# Patient Record
Sex: Male | Born: 1990 | Race: White | Hispanic: No | Marital: Married | Smoking: Never smoker
Health system: Southern US, Community
[De-identification: ages and names within clinical notes are randomized; demographics above are authoritative.]

## PROBLEM LIST (undated history)

## (undated) DIAGNOSIS — J329 Chronic sinusitis, unspecified: Secondary | ICD-10-CM

## (undated) DIAGNOSIS — F329 Major depressive disorder, single episode, unspecified: Secondary | ICD-10-CM

## (undated) DIAGNOSIS — F419 Anxiety disorder, unspecified: Principal | ICD-10-CM

## (undated) HISTORY — PX: WISDOM TOOTH EXTRACTION: SHX21

## (undated) HISTORY — PX: TONSILLECTOMY: SUR1361

## (undated) HISTORY — DX: Chronic sinusitis, unspecified: J32.9

## (undated) HISTORY — DX: Anxiety disorder, unspecified: F41.9

## (undated) HISTORY — DX: Major depressive disorder, single episode, unspecified: F32.9

---
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
## 2010-10-02 LAB — CBC AND DIFFERENTIAL: Hemoglobin: 15.6 g/dL (ref 13.5–17.5)

## 2012-04-12 ENCOUNTER — Ambulatory Visit (INDEPENDENT_AMBULATORY_CARE_PROVIDER_SITE_OTHER): Payer: PRIVATE HEALTH INSURANCE | Admitting: Family Medicine

## 2012-04-12 ENCOUNTER — Encounter: Payer: Self-pay | Admitting: Family Medicine

## 2012-04-12 VITALS — BP 138/74 | HR 67 | Ht 72.0 in | Wt 180.0 lb

## 2012-04-12 DIAGNOSIS — F32A Depression, unspecified: Secondary | ICD-10-CM | POA: Insufficient documentation

## 2012-04-12 DIAGNOSIS — F329 Major depressive disorder, single episode, unspecified: Secondary | ICD-10-CM

## 2012-04-12 DIAGNOSIS — F419 Anxiety disorder, unspecified: Secondary | ICD-10-CM | POA: Insufficient documentation

## 2012-04-12 DIAGNOSIS — F341 Dysthymic disorder: Secondary | ICD-10-CM

## 2012-04-12 DIAGNOSIS — J329 Chronic sinusitis, unspecified: Secondary | ICD-10-CM

## 2012-04-12 HISTORY — DX: Depression, unspecified: F32.A

## 2012-04-12 HISTORY — DX: Chronic sinusitis, unspecified: J32.9

## 2012-04-12 MED ORDER — CLONAZEPAM 0.5 MG PO TABS: 0.5000 mg | ORAL_TABLET | Freq: Two times a day (BID) | ORAL | Status: DC | PRN

## 2012-04-12 MED ORDER — ESCITALOPRAM OXALATE 20 MG PO TABS: 20.0000 mg | ORAL_TABLET | Freq: Every day | ORAL | Status: DC

## 2012-04-12 NOTE — Progress Notes (Signed)
CC: Casey Baird is a 22 y.o. male is here for Establish Care   Subjective: HPI:  Pleasant 22 year old here to establish care  Past medical history of anxiety and depression: He is currently taking Lexapro and as needed Klonopin. 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 He states his depression has greatly been controlled since starting Lexapro, denying any sadness, nor sleep disturbance nor appetite change nor relationship trouble nor thoughts of wanting to harm himself or others. He once tried Paxil and later Abilify which both cause more side effects than benefit. Family history significant for bipolar disorder on his mother side. He denies any recent or remote paranoia. He describes his anxiety and depression as less than mild these days. Exercise significantly helps both conditions. The medications have greatly improved his symptoms, his workplace will cause occasional declines, nothing else makes better or worse.    Patient complains of decreased libido ever since starting on Lexapro. He notices if he misses a dose his libido significantly increases. He is in a loving relationship with a long-time girlfriend. He describes the relationship as great. He is physically attracted toward her. He has no trouble initiating or maintaining an erection. He has no genitourinary complaints. Nothing else makes libido better or worse. Lack of libido is mild to moderate in severity and can be present any hour or day of the week  Review of Systems - General ROS: negative for - chills, fever, night sweats, weight gain or weight loss Ophthalmic ROS: negative for - decreased vision Psychological ROS: negative for - worsening anxiety or depression ENT ROS: negative for - hearing change, nasal congestion, tinnitus or allergies Hematological and Lymphatic ROS: negative for - bleeding problems, bruising or swollen lymph nodes Breast ROS:  negative Respiratory ROS: no cough, shortness of breath, or wheezing Cardiovascular ROS: no chest pain or dyspnea on exertion Gastrointestinal ROS: no abdominal pain, change in bowel habits, or black or bloody stools Genito-Urinary ROS: negative for - genital discharge, genital ulcers, incontinence or abnormal bleeding from genitals Musculoskeletal ROS: negative for - joint pain or muscle pain Neurological ROS: negative for - headaches or memory loss Dermatological ROS: negative for lumps, mole changes, rash and skin lesion changes  Past Medical History  Diagnosis Date  . Anxiety and depression 04/12/2012  . Chronic sinusitis 04/12/2012     Family History  Problem Relation Age of Onset  . Alcohol abuse Maternal Grandfather   . Depression    . Diabetes Maternal Uncle      History  Substance Use Topics  . Smoking status: Never Smoker   . Smokeless tobacco: Not on file  . Alcohol Use: 1.0 oz/week    2 drink(s) per week     Objective: Filed Vitals:   04/12/12 1330  BP: 138/74  Pulse: 67    General: Alert and Oriented, No Acute Distress HEENT: Pupils equal, round, reactive to light. Conjunctivae clear.   Moist mucous membranes,  Lungs: Clear to auscultation bilaterally, no wheezing/ronchi/rales.  Comfortable work of breathing. Good air movement. Cardiac: Regular rate and rhythm. Normal S1/S2.  No murmurs, rubs, nor gallops.   Extremities: No peripheral edema.  Strong peripheral pulses.  Mental Status: No depression, anxiety, nor agitation. Skin: Warm and dry.  Assessment & Plan: Casey Baird was seen today for establish care.  Diagnoses and associated orders for this visit:  Anxiety and depression - escitalopram (LEXAPRO) 20 MG tablet; Take 1  tablet (20 mg total) by mouth daily. - clonazePAM (KLONOPIN) 0.5 MG tablet; Take 1 tablet (0.5 mg total) by mouth 2 (two) times daily as needed.  Other Orders - Discontinue: escitalopram (LEXAPRO) 10 MG tablet; Take 10 mg by mouth  daily. - Discontinue: clonazePAM (KLONOPIN) 0.5 MG tablet; Take 0.5 mg by mouth 2 (two) times daily as needed.    Anxiety and depression: Stable, with [place] controlled substance database reviewed and shows no suspicious filling patterns. We'll provide patient with one month of Klonopin while outside records are present at our office. Continue current Lexapro regimen.  We discussed the possibility of switching to another SSRI/SNRI to see if we can minimize sexual side effects, he would prefer to continue with Lexapro given its fantastic response to combating anxiety and depression.  Discussed with patient that Lexapro is most likely the culprit 2 low libido.  30 minutes spent face-to-face during visit today of which at least 50% was counseling or coordinating care regarding anxiety, depression, low libido.   Return in about 4 weeks (around 05/10/2012).

## 2012-04-14 ENCOUNTER — Encounter: Payer: Self-pay | Admitting: *Deleted

## 2012-04-15 ENCOUNTER — Encounter: Payer: Self-pay | Admitting: Family Medicine

## 2012-04-15 ENCOUNTER — Telehealth: Payer: Self-pay | Admitting: Family Medicine

## 2012-04-15 DIAGNOSIS — F329 Major depressive disorder, single episode, unspecified: Secondary | ICD-10-CM

## 2012-04-15 NOTE — Telephone Encounter (Signed)
Sue Lush, Will you please let Casey Baird know that I have placed his Dr. Marchia Bond records at the front desk, it looks like he wanted them after we received them. Thank you.

## 2012-04-15 NOTE — Telephone Encounter (Signed)
Left message on vm

## 2012-04-16 ENCOUNTER — Encounter: Payer: Self-pay | Admitting: *Deleted

## 2012-04-27 ENCOUNTER — Ambulatory Visit (INDEPENDENT_AMBULATORY_CARE_PROVIDER_SITE_OTHER): Payer: PRIVATE HEALTH INSURANCE | Admitting: Family Medicine

## 2012-04-27 ENCOUNTER — Encounter: Payer: Self-pay | Admitting: Family Medicine

## 2012-04-27 VITALS — BP 121/62 | HR 65 | Ht 73.0 in | Wt 181.0 lb

## 2012-04-27 DIAGNOSIS — F329 Major depressive disorder, single episode, unspecified: Secondary | ICD-10-CM

## 2012-04-27 DIAGNOSIS — Z131 Encounter for screening for diabetes mellitus: Secondary | ICD-10-CM

## 2012-04-27 DIAGNOSIS — F419 Anxiety disorder, unspecified: Secondary | ICD-10-CM

## 2012-04-27 DIAGNOSIS — Q833 Accessory nipple: Secondary | ICD-10-CM | POA: Insufficient documentation

## 2012-04-27 DIAGNOSIS — Z0000 Encounter for general adult medical examination without abnormal findings: Secondary | ICD-10-CM

## 2012-04-27 DIAGNOSIS — Z13220 Encounter for screening for lipoid disorders: Secondary | ICD-10-CM

## 2012-04-27 DIAGNOSIS — L723 Sebaceous cyst: Secondary | ICD-10-CM

## 2012-04-27 DIAGNOSIS — Z23 Encounter for immunization: Secondary | ICD-10-CM

## 2012-04-27 DIAGNOSIS — Q838 Other congenital malformations of breast: Secondary | ICD-10-CM

## 2012-04-27 DIAGNOSIS — F341 Dysthymic disorder: Secondary | ICD-10-CM

## 2012-04-27 LAB — LIPID PANEL
LDL Cholesterol: 87 mg/dL (ref 0–99)
Total CHOL/HDL Ratio: 4.2 Ratio
VLDL: 39 mg/dL (ref 0–40)

## 2012-04-27 LAB — BASIC METABOLIC PANEL WITH GFR
BUN: 8 mg/dL (ref 6–23)
CO2: 31 mEq/L (ref 19–32)
Chloride: 102 mEq/L (ref 96–112)
Creat: 0.83 mg/dL (ref 0.50–1.35)
GFR, Est Non African American: >89 mL/min
Glucose, Bld: 96 mg/dL (ref 70–99)
Sodium: 140 mEq/L (ref 135–145)

## 2012-04-27 NOTE — Patient Instructions (Addendum)
Dr. Shams Fill's General Advice Following Your Complete Physical Exam  The Benefits of Regular Exercise: Unless you suffer from an uncontrolled cardiovascular condition, studies strongly suggest that regular exercise and physical activity will add to both the quality and length of your life.  The World Health Organization recommends 150 minutes of moderate intensity aerobic activity every week.  This is best split over 3-4 days a week, and can be as simple as a brisk walk for just over 35 minutes "most days of the week".  This type of exercise has been shown to lower LDL-Cholesterol, lower average blood sugars, lower blood pressure, lower cardiovascular disease risk, improve memory, and increase one's overall sense of wellbeing.  The addition of anaerobic (or "strength training") exercises offers additional benefits including but not limited to increased metabolism, prevention of osteoporosis, and improved overall cholesterol levels.  How Can I Strive For A Low-Fat Diet?: Current guidelines recommend that 25-35 percent of your daily energy (food) intake should come from fats.  One might ask how can this be achieved without having to dissect each meal on a daily basis?  Switch to skim or 1% milk instead of whole milk.  Focus on lean meats such as ground turkey, fresh fish, baked chicken, and lean cuts of beef as your source of dietary protein.  Consume less than 300mg/day of dietary cholesterol.  Limit trans fatty acid consumption primarily by limiting synthetic trans fats such as partially hydrogenated oils (Ex: fried fast foods).  Focus efforts on reducing your intake of "solid" fats (Ex: Butter).  Substitute olive or vegetable oil for solid fats where possible.  Moderation of Salt Intake: Provided you don't carry a diagnosis of congestive heart failure nor renal failure, I recommend a daily allowance of no more than 2300 mg of salt (sodium).  Keeping under this daily goal is associated with a  decreased risk of cardiovascular events, creeping above it can lead to elevated blood pressures and increases your risk of cardiovascular events.  Milligrams (mg) of salt is listed on all nutrition labels, and your daily intake can add up faster than you think.  Most canned and frozen dinners can pack in over half your daily salt allowance in one meal.    Lifestyle Health Risks: Certain lifestyle choices carry specific health risks.  As you may already know, tobacco use has been associated with increasing one's risk of cardiovascular disease, pulmonary disease, numerous cancers, among many other issues.  What you may not know is that there are medications and nicotine replacement strategies that can more than double your chances of successfully quitting.  I would be thrilled to help manage your quitting strategy if you currently use tobacco products.  When it comes to alcohol use, I've yet to find an "ideal" daily allowance.  Provided an individual does not have a medical condition that is exacerbated by alcohol consumption, general guidelines determine "safe drinking" as no more than two standard drinks for a man or no more than one standard drink for a male per day.  However, much debate still exists on whether any amount of alcohol consumption is technically "safe".  My general advice, keep alcohol consumption to a minimum for general health promotion.  If you or others believe that alcohol, tobacco, or recreational drug use is interfering with your life, I would be happy to provide confidential counseling regarding treatment options.  General "Over The Counter" Nutrition Advice: Postmenopausal women should aim for a daily calcium intake of 1200 mg, however a significant   portion of this might already be provided by diets including milk, yogurt, cheese, and other dairy products.  Vitamin D has been shown to help preserve bone density, prevent fatigue, and has even been shown to help reduce falls in the  elderly.  Ensuring a daily intake of 800 Units of Vitamin D is a good place to start to enjoy the above benefits, we can easily check your Vitamin D level to see if you'd potentially benefit from supplementation beyond 800 Units a day.  Folic Acid intake should be of particular concern to women of childbearing age.  Daily consumption of 400-800 mcg of Folic Acid is recommended to minimize the chance of spinal cord defects in a fetus should pregnancy occur.    For many adults, accidents still remain one of the most common culprits when it comes to cause of death.  Some of the simplest but most effective preventitive habits you can adopt include regular seatbelt use, proper helmet use, securing firearms, and regularly testing your smoke and carbon monoxide detectors.  Selig Wampole B. Khair Chasteen DO [facility] [address], [address] [place], [place] [address] Phone: [phone]  

## 2012-04-27 NOTE — Progress Notes (Signed)
CC: Casey Baird is a 22 y.o. male is here for Annual Exam  Colonoscopy: No family history of colon cancer nor personal history of melanoma or bloody stools Prostate: Discussed screening risks/beneifts with patient on 04/27/2012  Influenza Vaccine: received 04/27/2012 Pneumovax: not indicated Td/Tdap: Td 2005 Zoster: not indicated  Lipids 2009 and normal  Subjective: HPI:  Patient presents with no acute complaints  Over the past 2 weeks have you been bothered by: - Little interest or pleasure in doing things: no - Feeling down depressed or hopeless: no  Patient is trying to get into an exercise program on a daily basis but 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 Tries to watch what he eats but admits room for improvement. Denies smoking or recreational drug abuse. Rare alcohol use. Sexually active with a single  partner  Review of Systems - General ROS: negative for - chills, fever, night sweats, weight gain or weight loss Ophthalmic ROS: negative for - decreased vision Psychological ROS: negative for - anxiety or depression ENT ROS: negative for - hearing change, nasal congestion, tinnitus or allergies Hematological and Lymphatic ROS: negative for - bleeding problems, bruising or swollen lymph nodes Breast ROS: negative Respiratory ROS: no cough, shortness of breath, or wheezing Cardiovascular ROS: no chest pain or dyspnea on exertion Gastrointestinal ROS: no abdominal pain, change in bowel habits, or black or bloody stools Genito-Urinary ROS: negative for - genital discharge, genital ulcers, incontinence or abnormal bleeding from genitals Musculoskeletal ROS: negative for - joint pain or muscle pain Neurological ROS: negative for - headaches or memory loss Dermatological ROS: negative for lumps, mole changes, rash and skin lesion changes  Past Medical History  Diagnosis Date  . Anxiety and depression 04/12/2012  . Chronic sinusitis 04/12/2012     Family  History  Problem Relation Age of Onset  . Alcohol abuse Maternal Grandfather   . Depression    . Diabetes Maternal Uncle      History  Substance Use Topics  . Smoking status: Never Smoker   . Smokeless tobacco: Not on file  . Alcohol Use: 1.0 oz/week    2 drink(s) per week     Objective: Filed Vitals:   04/27/12 0914  BP: 121/62  Pulse: 65    General: No Acute Distress HEENT: Atraumatic, normocephalic, conjunctivae normal without scleral icterus.  No nasal discharge, hearing grossly intact, TMs with good landmarks bilaterally with no middle ear abnormalities, posterior pharynx clear without oral lesions. Neck: Supple, trachea midline, no cervical nor supraclavicular adenopathy. Pulmonary: Clear to auscultation bilaterally without wheezing, rhonchi, nor rales. Cardiac: Regular rate and rhythm.  No murmurs, rubs, nor gallops. No peripheral edema.  2+ peripheral pulses bilaterally. Abdomen: Bowel sounds normal.  No masses.  Non-tender without rebound.  Negative Murphy's sign. GU: Patient declines MSK: Grossly intact, no signs of weakness.  Full strength throughout upper and lower extremities.  Full ROM in upper and lower extremities.  No midline spinal tenderness. Neuro: Gait unremarkable, CN II-XII grossly intact.  C5-C6 Reflex 2/4 Bilaterally, L4 Reflex 2/4 Bilaterally.  Cerebellar function intact. Skin: No rashes. 2 accessory nipples, tender 1 cm subcutaneous nodule in the left elbow Psych: Alert and oriented to person/place/time.  Thought process normal. No anxiety/depression.  Assessment & Plan: Casey Baird was seen today for annual exam.  Diagnoses and associated orders for this visit:  Screening, lipid - Lipid panel  Screening for diabetes mellitus - BASIC METABOLIC PANEL WITH GFR  Inflamed sebaceous cyst - Ambulatory referral to  Dermatology  Anxiety and depression  Supernumerary nipple    Patient is due for screening lipids as is been more than 5 years  Patient is  due for diabetic screening Sebaceous cyst: Patient would like removal if possible, referral to dermatology Health maintenance topics were updated about the history of present illness, discussed healthy lifestyle interventions in order to promote general health and well-being. Handout given to the patient re emphasizing these topics.  Return in about 3 months (around 07/25/2012).

## 2012-05-26 ENCOUNTER — Ambulatory Visit: Payer: PRIVATE HEALTH INSURANCE | Admitting: Sports Medicine

## 2012-05-26 ENCOUNTER — Ambulatory Visit (INDEPENDENT_AMBULATORY_CARE_PROVIDER_SITE_OTHER): Payer: PRIVATE HEALTH INSURANCE | Admitting: Family Medicine

## 2012-05-26 ENCOUNTER — Encounter: Payer: Self-pay | Admitting: Family Medicine

## 2012-05-26 VITALS — BP 123/71 | HR 80 | Temp 98.0°F | Wt 180.0 lb

## 2012-05-26 MED ORDER — CEFUROXIME AXETIL 250 MG PO TABS: 250.0000 mg | ORAL_TABLET | Freq: Two times a day (BID) | ORAL | Status: DC

## 2012-05-26 NOTE — Progress Notes (Signed)
CC: Casey Baird is a 22 y.o. male is here for URI   Subjective: HPI:   Patient complains of facial pressure and nasal congestion with moderate to severe in severity. Symptoms came on suddenly Monday evening. Originally accompanied by sore throat that is now gone. Symptoms are present all hours of the day worse first thing in the morning and improves throughout the day with use of DayQuil. Symptoms were to the degree where he had to call out of work today do to feeling uncomfortable. He's had some mild fatigue but denies myalgias or joint pain. He denies fevers, chills, confusion, motor sensory disturbances, cough, shortness of breath, wheezing. Other than above nothing seems to make the symptoms better or worse. Pressure is localized in the forehead and worse with leaning forward.     Review Of Systems Outlined In HPI  Past Medical History  Diagnosis Date  . Anxiety and depression 04/12/2012  . Chronic sinusitis 04/12/2012     Family History  Problem Relation Age of Onset  . Alcohol abuse Maternal Grandfather   . Depression    . Diabetes Maternal Uncle      History  Substance Use Topics  . Smoking status: Never Smoker   . Smokeless tobacco: Not on file  . Alcohol Use: 1.0 oz/week    2 drink(s) per week     Objective: Filed Vitals:   05/26/12 1510  BP: 123/71  Pulse: 80  Temp: 98 F (36.7 C)    General: Alert and Oriented, No Acute Distress HEENT: Pupils equal, round, reactive to light. Conjunctivae clear.  External ears unremarkable, canals clear with intact TMs with appropriate landmarks.  Middle ear appears open without effusion. Erythematous and boggy inferior turbinates with moderate mucoid discharge bilaterally.  Moist mucous membranes, pharynx without inflammation nor lesions.  Neck supple without palpable lymphadenopathy nor abnormal masses. Frontal sinus tenderness to percussion Lungs: Clear to auscultation bilaterally, no wheezing/ronchi/rales.  Comfortable work of  breathing. Good air movement. Cardiac: Regular rate and rhythm. Normal S1/S2.  No murmurs, rubs, nor gallops.   Cranial nerves II through XII grossly intact Mental Status: No depression, anxiety, nor agitation. Skin: Warm and dry.  Assessment & Plan: Casey Baird was seen today for uri.  Diagnoses and associated orders for this visit:  Sinusitis - cefUROXime (CEFTIN) 250 MG tablet; Take 1 tablet (250 mg total) by mouth 2 (two) times daily.    Discussed with patient that at this point symptoms are most likely due to a viral etiology. If symptoms persist a day 7 and are worsening or not improved it would be wise to start antibiotic. He reports multiple treatment failures to Augmentin in the past. He reports success with Ceftin in the past for above symptoms lasting greater than 7 days. Continued DayQuil and may add ibuprofen with nasal saline washes to help symptoms. Signs and symptoms requring emergent/urgent reevaluation were discussed with the patient.  Return if symptoms worsen or fail to improve.

## 2012-06-17 ENCOUNTER — Encounter: Payer: Self-pay | Admitting: Family Medicine

## 2012-06-17 DIAGNOSIS — J329 Chronic sinusitis, unspecified: Secondary | ICD-10-CM

## 2012-07-14 ENCOUNTER — Telehealth: Payer: Self-pay | Admitting: *Deleted

## 2012-07-14 DIAGNOSIS — F329 Major depressive disorder, single episode, unspecified: Secondary | ICD-10-CM

## 2012-07-14 MED ORDER — CLONAZEPAM 0.5 MG PO TABS: 0.5000 mg | ORAL_TABLET | Freq: Two times a day (BID) | ORAL | Status: DC | PRN

## 2012-07-14 NOTE — Telephone Encounter (Signed)
rx faxed to [facility]

## 2012-07-14 NOTE — Telephone Encounter (Signed)
[place] controlled substances database reviewed.  Sue Lush, Rx placed in you inbox ready for pick up or sending.

## 2012-07-14 NOTE — Telephone Encounter (Signed)
Pt calls and request a refill on the Klonopin

## 2012-07-19 ENCOUNTER — Ambulatory Visit (INDEPENDENT_AMBULATORY_CARE_PROVIDER_SITE_OTHER): Payer: PRIVATE HEALTH INSURANCE | Admitting: Family Medicine

## 2012-07-19 DIAGNOSIS — Z23 Encounter for immunization: Secondary | ICD-10-CM

## 2012-07-19 NOTE — Progress Notes (Signed)
I was present for all necessary aspects of this visit

## 2012-07-19 NOTE — Progress Notes (Signed)
  Subjective:    Patient ID: Casey Baird, male    DOB: [birth year], 22 y.o.   MRN: [id number] Tdap injection given right deltoid without complications. Barry Dienes, LPN  HPI    Review of Systems     Objective:   Physical Exam        Assessment & Plan:

## 2012-07-26 ENCOUNTER — Encounter: Payer: Self-pay | Admitting: Family Medicine

## 2012-07-26 ENCOUNTER — Ambulatory Visit (INDEPENDENT_AMBULATORY_CARE_PROVIDER_SITE_OTHER): Payer: PRIVATE HEALTH INSURANCE | Admitting: Family Medicine

## 2012-07-26 VITALS — BP 99/61 | HR 58 | Wt 177.0 lb

## 2012-07-26 DIAGNOSIS — J309 Allergic rhinitis, unspecified: Secondary | ICD-10-CM

## 2012-07-26 DIAGNOSIS — F329 Major depressive disorder, single episode, unspecified: Secondary | ICD-10-CM

## 2012-07-26 DIAGNOSIS — E781 Pure hyperglyceridemia: Secondary | ICD-10-CM

## 2012-07-26 DIAGNOSIS — F341 Dysthymic disorder: Secondary | ICD-10-CM

## 2012-07-26 DIAGNOSIS — J358 Other chronic diseases of tonsils and adenoids: Secondary | ICD-10-CM | POA: Insufficient documentation

## 2012-07-26 MED ORDER — ESCITALOPRAM OXALATE 20 MG PO TABS: 10.0000 mg | ORAL_TABLET | Freq: Every day | ORAL | Status: DC

## 2012-07-26 MED ORDER — BECLOMETHASONE DIPROPIONATE 80 MCG/ACT NA AERS: 2.0000 | INHALATION_SPRAY | Freq: Every day | NASAL | Status: DC

## 2012-07-26 NOTE — Progress Notes (Signed)
CC: Casey Baird is a 22 y.o. male is here for Anxiety   Subjective: HPI:  Followup anxiety and depression: Continues on daily Lexapro he decrease this to 10 mg a little over a month ago to try to stretch medication regimen and denies any worsening subjective anxiety or depression. Denies sleep disturbance, decreased interest in hobbies, nor relationship trouble. Continues Klonopin twice a day only as needed. Denies mental disturbance. He's quite happy with his current regimen and quality of life  Patient complains of clumps of matter that come out of his tonsils. Occurs a few times a year. They're painless but due have a horrible odor. He's currently having some throat irritation right now and wondering if this is related to one that was recently removed. Describes this right-sided soreness, worse with swallowing, nothing else seems to make better or worse. No interventions as of yet. Discomfort described as mild in severity. Has been present for a week or 2. Denies fevers, chills, nausea, choking, shortness of breath. Does admit to nasal congestion worse than normal for him.  He would like to go over lipid panel showing triglycerides 190 from February. Since then has increased physical activity.  Denies personal history of right upper quadrant pain, elevated LFTs, nor pancreatitis.  Review Of Systems Outlined In HPI  Past Medical History  Diagnosis Date  . Anxiety and depression 04/12/2012  . Chronic sinusitis 04/12/2012     Family History  Problem Relation Age of Onset  . Alcohol abuse Maternal Grandfather   . Depression    . Diabetes Maternal Uncle      History  Substance Use Topics  . Smoking status: Never Smoker   . Smokeless tobacco: Not on file  . Alcohol Use: 1.0 oz/week    2 drink(s) per week     Objective: Filed Vitals:   07/26/12 0940  BP: 99/61  Pulse: 58    General: Alert and Oriented, No Acute Distress HEENT: Pupils equal, round, reactive to light. Conjunctivae  clear.  External ears unremarkable, canals clear with intact TMs with appropriate landmarks.  Middle ear appears open without effusion. Pink inferior turbinates.  Moist mucous membranes, pharynx without inflammation nor lesions.  Neck supple without palpable lymphadenopathy nor abnormal masses. Lungs: Clear to auscultation bilaterally, no wheezing/ronchi/rales.  Comfortable work of breathing. Good air movement. Cardiac: Regular rate and rhythm. Normal S1/S2.  No murmurs, rubs, nor gallops.   Extremities: No peripheral edema.  Strong peripheral pulses.  Mental Status: No depression, anxiety, nor agitation. Skin: Warm and dry.  Assessment & Plan: Casey Baird was seen today for anxiety.  Diagnoses and associated orders for this visit:  Anxiety and depression - escitalopram (LEXAPRO) 20 MG tablet; Take 0.5 tablets (10 mg total) by mouth daily.  Allergic sinusitis - Beclomethasone Dipropionate (QNASL) 80 MCG/ACT AERS; Place 2 Act into the nose daily.  Calculus, tonsil  Hypertriglyceridemia    Anxiety and depression: Stable and controlled continue Lexapro and as needed Klonopin Allergic sinusitis: Throat irritation is likely postnasal drip, sample of QNasl.   Calculus tonsil: Discussed using   hydrogen peroxide gargles to prevent future calculus, can consider WaterPik or water pressurized using syringe daily Hypertriglyceridemia: Discussed goals less than 150 and to focus on moderate activity daily for 30 minutes or more in addition to diet low in simple sugars and low in saturated fats, can recheck the summer  25 minutes spent face-to-face during visit today of which at least 50% was counseling or coordinating care regarding hypertriglyceridemia, tonsil calculus, allergic  sinusitis, anxiety and depression.   Return in about 3 weeks (around 08/16/2012).

## 2012-10-20 ENCOUNTER — Ambulatory Visit (INDEPENDENT_AMBULATORY_CARE_PROVIDER_SITE_OTHER): Payer: PRIVATE HEALTH INSURANCE | Admitting: Physician Assistant

## 2012-10-20 ENCOUNTER — Encounter: Payer: Self-pay | Admitting: Physician Assistant

## 2012-10-20 VITALS — BP 131/75 | HR 87 | Wt 180.0 lb

## 2012-10-20 DIAGNOSIS — J3489 Other specified disorders of nose and nasal sinuses: Secondary | ICD-10-CM

## 2012-10-20 DIAGNOSIS — J309 Allergic rhinitis, unspecified: Secondary | ICD-10-CM

## 2012-10-20 DIAGNOSIS — R0981 Nasal congestion: Secondary | ICD-10-CM

## 2012-10-20 MED ORDER — BECLOMETHASONE DIPROPIONATE 80 MCG/ACT NA AERS: 2.0000 | INHALATION_SPRAY | Freq: Every day | NASAL | Status: DC

## 2012-10-20 MED ORDER — LEVOCETIRIZINE DIHYDROCHLORIDE 5 MG PO TABS: 5.0000 mg | ORAL_TABLET | Freq: Every evening | ORAL | Status: DC

## 2012-10-20 MED ORDER — PREDNISONE 50 MG PO TABS: ORAL_TABLET | ORAL | Status: DC

## 2012-10-20 NOTE — Progress Notes (Signed)
  Subjective:    Patient ID: Casey Baird, male    DOB: [birth year], 21 y.o.   MRN: [id number]  HPI Patient presents to the clinic for allergies. He is a pt of Dr. Gardner Candle. He has a long history of allergies. Dr. Ivan Anchors gave him qnasl and it did help but he ran out. For the last 3 weeks off and on sinus pressure, itchy eyes, ear pain have been present. He feels completely drained. He takes sudafed off and on and helps significantly. OTC zyrtec,claritin, allegra have been tried but do not help.singulair tried but also did not help. Denies ST, cough, SOB, wheezing.      Review of Systems     Objective:   Physical Exam  Constitutional: He is oriented to person, place, and time. He appears well-developed and well-nourished.  HENT:  Head: Normocephalic and atraumatic.  Right Ear: External ear normal.  Left Ear: External ear normal.  TM's clear.   Oropharynx was erythematous with PND.   No pain with palpation over maxillary or frontal sinuses.   Nasal turbinates red and swollen.  Eyes: Right eye exhibits no discharge. Left eye exhibits no discharge.  Right eye mild injection in the lateral conjunctiva.  Neck: Normal range of motion. Neck supple.  Cardiovascular: Normal rate, regular rhythm and normal heart sounds.   Pulmonary/Chest: Effort normal and breath sounds normal. He has no wheezes.  Lymphadenopathy:    He has no cervical adenopathy.  Neurological: He is alert and oriented to person, place, and time.  Skin: Skin is warm and dry.  Psychiatric: He has a normal mood and affect. His behavior is normal.          Assessment & Plan:  Allergies/sinus pressure/ear pain/chronic sinusitis- I gave rx for qnasl with coupon card. I sent rx for xyzal to try daily for allergies. I printed prednisone for 5 days. If no relief with nasal spray then fill prednisone to get ahead of inflammation and sinus pressure. I do not think bacterial since good and bad with decongestant. i will refer to  allergist for testing. I think he might be a candidate for shots. Follow up with Dr. Ivan Anchors.

## 2012-10-20 NOTE — Patient Instructions (Addendum)
xyzal to take daily.   Prednisone if inflammation or sinus pressure does not improve.   Use Qnasl daily.  Will refer to allergist for testing.

## 2012-10-25 ENCOUNTER — Ambulatory Visit: Payer: PRIVATE HEALTH INSURANCE | Admitting: Family Medicine

## 2012-11-01 ENCOUNTER — Ambulatory Visit (INDEPENDENT_AMBULATORY_CARE_PROVIDER_SITE_OTHER): Payer: PRIVATE HEALTH INSURANCE | Admitting: Family Medicine

## 2012-11-01 ENCOUNTER — Other Ambulatory Visit: Payer: Self-pay | Admitting: Family Medicine

## 2012-11-01 ENCOUNTER — Encounter: Payer: Self-pay | Admitting: Family Medicine

## 2012-11-01 VITALS — BP 124/73 | HR 64 | Wt 178.0 lb

## 2012-11-01 DIAGNOSIS — F341 Dysthymic disorder: Secondary | ICD-10-CM

## 2012-11-01 DIAGNOSIS — E781 Pure hyperglyceridemia: Secondary | ICD-10-CM | POA: Insufficient documentation

## 2012-11-01 DIAGNOSIS — F329 Major depressive disorder, single episode, unspecified: Secondary | ICD-10-CM

## 2012-11-01 LAB — LIPID PANEL
Cholesterol: 178 mg/dL (ref 0–200)
LDL Cholesterol: 101 mg/dL — ABNORMAL HIGH (ref 0–99)
Total CHOL/HDL Ratio: 3.7 Ratio
VLDL: 29 mg/dL (ref 0–40)

## 2012-11-01 MED ORDER — ESCITALOPRAM OXALATE 20 MG PO TABS: 10.0000 mg | ORAL_TABLET | Freq: Every day | ORAL | Status: DC

## 2012-11-01 MED ORDER — CLONAZEPAM 0.5 MG PO TABS: 0.5000 mg | ORAL_TABLET | Freq: Two times a day (BID) | ORAL | Status: DC | PRN

## 2012-11-01 NOTE — Progress Notes (Signed)
CC: Casey Baird is a 22 y.o. male is here for Anxiety   Subjective: HPI:  Followup anxiety depression: Continues to take Klonopin only on days he is working has not been taking it on days off. He denies tremor, paranoia, anxiety, sleep disturbance, nor hallucinations. Denies thoughts wanting to harm himself or others. He is quite happy with his current lack of anxiety and depression  Hypertriglyceridemia: Since I saw him last he is slightly increased physical activity no change in diet. Still occasionally eats fast foods. Denies right upper quadrant pain nor epigastric pain   Review Of Systems Outlined In HPI  Past Medical History  Diagnosis Date  . Anxiety and depression 04/12/2012  . Chronic sinusitis 04/12/2012     Family History  Problem Relation Age of Onset  . Alcohol abuse Maternal Grandfather   . Depression    . Diabetes Maternal Uncle      History  Substance Use Topics  . Smoking status: Never Smoker   . Smokeless tobacco: Not on file  . Alcohol Use: 1.0 oz/week    2 drink(s) per week     Objective: Filed Vitals:   11/01/12 1103  BP: 124/73  Pulse: 64    Vital signs reviewed. General: Alert and Oriented, No Acute Distress HEENT: Pupils equal, round, reactive to light. Conjunctivae clear.  External ears unremarkable.  Moist mucous membranes. Lungs: Clear and comfortable work of breathing, speaking in full sentences without accessory muscle use. Cardiac: Regular rate and rhythm.  Neuro: CN II-XII grossly intact, gait normal. Extremities: No peripheral edema.  Strong peripheral pulses.  Mental Status: No depression, anxiety, nor agitation. Logical though process. Skin: Warm and dry. Assessment & Plan: [name] was seen today for anxiety.  Diagnoses and associated orders for this visit:  Hypertriglyceridemia - Lipid panel  Anxiety and depression - clonazePAM (KLONOPIN) 0.5 MG tablet; Take 1 tablet (0.5 mg total) by mouth 2 (two) times daily as  needed. - escitalopram (LEXAPRO) 20 MG tablet; Take 0.5 tablets (10 mg total) by mouth daily.    Hypertriglyceridemia: Uncontrolled he is due for repeat fasting lipids we'll obtain today Anxiety and depression: Controlled continue Lexapro and as needed Klonopin  Return in about 3 months (around 02/01/2013).

## 2012-11-24 ENCOUNTER — Ambulatory Visit (INDEPENDENT_AMBULATORY_CARE_PROVIDER_SITE_OTHER): Payer: PRIVATE HEALTH INSURANCE | Admitting: Family Medicine

## 2012-11-24 ENCOUNTER — Encounter: Payer: Self-pay | Admitting: Family Medicine

## 2012-11-24 VITALS — BP 125/72 | HR 84 | Temp 97.4°F | Wt 181.0 lb

## 2012-11-24 DIAGNOSIS — J329 Chronic sinusitis, unspecified: Secondary | ICD-10-CM

## 2012-11-24 DIAGNOSIS — B9689 Other specified bacterial agents as the cause of diseases classified elsewhere: Secondary | ICD-10-CM

## 2012-11-24 DIAGNOSIS — A499 Bacterial infection, unspecified: Secondary | ICD-10-CM

## 2012-11-24 MED ORDER — LEVOFLOXACIN 500 MG PO TABS: 500.0000 mg | ORAL_TABLET | Freq: Every day | ORAL | Status: DC

## 2012-11-24 MED ORDER — AMOXICILLIN-POT CLAVULANATE 500-125 MG PO TABS: ORAL_TABLET | ORAL | Status: DC

## 2012-11-24 NOTE — Progress Notes (Signed)
CC: Casey Baird is a 23 y.o. male is here for Sinusitis   Subjective: HPI:  Patient complains of nasal congestion and facial pressure localized below and between the eyes and has been present for one week worsening on a daily basis. Associated with fatigue, nonproductive cough, all of which have been worsening a daily basis. He continues on Allegra and she nasal no other changes to medications. Symptoms are present on a daily basis all hours of the day nothing particularly makes better or worse currently moderate severity. Denies wheezing, shortness of breath, motor sensory disturbances, headache, vision problems, chest pain   Review Of Systems Outlined In HPI  Past Medical History  Diagnosis Date  . Anxiety and depression 04/12/2012  . Chronic sinusitis 04/12/2012     Family History  Problem Relation Age of Onset  . Alcohol abuse Maternal Grandfather   . Depression    . Diabetes Maternal Uncle      History  Substance Use Topics  . Smoking status: Never Smoker   . Smokeless tobacco: Not on file  . Alcohol Use: 1.0 oz/week    2 drink(s) per week     Objective: Filed Vitals:   11/24/12 1452  BP: 125/72  Pulse: 84  Temp: 97.4 F (36.3 C)    General: Alert and Oriented, No Acute Distress HEENT: Pupils equal, round, reactive to light. Conjunctivae clear.  External ears unremarkable, canals clear with intact TMs with appropriate landmarks.  Middle ear appears open without effusion. Bony inferior turbinates with moderate mucoid discharge  Moist mucous membranes, pharynx without inflammation nor lesions.  Neck supple without palpable lymphadenopathy nor abnormal masses. Maxillary sinus tenderness bilaterally Lungs: Clear to auscultation bilaterally, no wheezing/ronchi/rales.  Comfortable work of breathing. Good air movement. Cardiac: Regular rate and rhythm. Normal S1/S2.  No murmurs, rubs, nor gallops.   Mental Status: No depression, anxiety, nor agitation. Skin: Warm and  dry. Cranial nerves II through XII grossly intact  Assessment & Plan: Casey Baird was seen today for sinusitis.  Diagnoses and associated orders for this visit:  Bacterial sinusitis - Discontinue: amoxicillin-clavulanate (AUGMENTIN) 500-125 MG per tablet; Take one by mouth every 8 hours for ten total days. - levofloxacin (LEVAQUIN) 500 MG tablet; Take 1 tablet (500 mg total) by mouth daily.    Bacterial sinusitis: Patient reports GI intolerance to Augmentin therefore we'll start levofloxacin encouraged to consider Alka-Seltzer cold and sinus as needed   Return if symptoms worsen or fail to improve.

## 2013-01-24 ENCOUNTER — Telehealth: Payer: Self-pay | Admitting: Family Medicine

## 2013-01-24 DIAGNOSIS — F329 Major depressive disorder, single episode, unspecified: Secondary | ICD-10-CM

## 2013-01-24 NOTE — Telephone Encounter (Signed)
Left message on vm to call back and let us know what rx he needs refill and we will either refill it or he may need a visit

## 2013-01-24 NOTE — Telephone Encounter (Signed)
Pt left vm. When he called pharmacy, he was told his script had expired and he would need to contact his dr's office.

## 2013-01-25 MED ORDER — CLONAZEPAM 0.5 MG PO TABS: 0.5000 mg | ORAL_TABLET | Freq: Two times a day (BID) | ORAL | Status: DC | PRN

## 2013-01-25 NOTE — Addendum Note (Signed)
Addended by: Laren Boom on: 01/25/2013 01:37 PM   Modules accepted: Orders

## 2013-01-25 NOTE — Telephone Encounter (Signed)
Casey Baird, Rx placed in in-box ready for pickup/faxing.  

## 2013-01-27 ENCOUNTER — Encounter: Payer: Self-pay | Admitting: Family Medicine

## 2013-01-27 ENCOUNTER — Ambulatory Visit (INDEPENDENT_AMBULATORY_CARE_PROVIDER_SITE_OTHER): Payer: PRIVATE HEALTH INSURANCE | Admitting: Family Medicine

## 2013-01-27 ENCOUNTER — Other Ambulatory Visit: Payer: Self-pay

## 2013-01-27 VITALS — BP 130/75 | HR 77 | Temp 97.9°F | Wt 185.0 lb

## 2013-01-27 DIAGNOSIS — F341 Dysthymic disorder: Secondary | ICD-10-CM

## 2013-01-27 DIAGNOSIS — F329 Major depressive disorder, single episode, unspecified: Secondary | ICD-10-CM

## 2013-01-27 DIAGNOSIS — J309 Allergic rhinitis, unspecified: Secondary | ICD-10-CM

## 2013-01-27 MED ORDER — MONTELUKAST SODIUM 10 MG PO TABS: 10.0000 mg | ORAL_TABLET | ORAL | Status: DC

## 2013-01-27 NOTE — Progress Notes (Signed)
CC: Casey Baird is a 22 y.o. male is here for Headache   Subjective: HPI:  Patient presents for complaints of headache that was present when he woke up this morning woke up with headache not because of headache. Described as pressure in the forehead nonradiating mild in severity. Bothersome enough to where he called out of work. Has been associated with worsening nasal congestion over the past few weeks despite using pseudoephedrine and Q. nasal daily.  Denies motor sensory disturbances or vision loss prior during or after the headache. It is for the most part gone now without any intervention.  Denies photophobia no neck pain or rash.  Complains that he feels that his depression seems to be worsening last month he was absent mouth mild in severity present on a daily basis worse with the change in daily, also worse with worsening seasonal allergies. No thoughts of wanting to harm self or others but does have decreased interest in social activities and hobbies. Symptoms seem to be persistent and is noticeable for the last month. Nothing else particularly makes better or worse. Has gained 5 pounds in the last month unintentionally. Denies uncontrolled anxiety or other mental disturbance   Review Of Systems Outlined In HPI  Past Medical History  Diagnosis Date  . Anxiety and depression 04/12/2012  . Chronic sinusitis 04/12/2012     Family History  Problem Relation Age of Onset  . Alcohol abuse Maternal Grandfather   . Depression    . Diabetes Maternal Uncle      History  Substance Use Topics  . Smoking status: Never Smoker   . Smokeless tobacco: Not on file  . Alcohol Use: 1.0 oz/week    2 drink(s) per week     Objective: Filed Vitals:   01/27/13 1027  BP: 130/75  Pulse: 77  Temp: 97.9 F (36.6 C)    General: Alert and Oriented, No Acute Distress HEENT: Pupils equal, round, reactive to light. Conjunctivae clear.  External ears unremarkable, canals clear with intact TMs with  appropriate landmarks.  Middle ear appears open without effusion. Boggy erythematous inferior turbinates with mild mucoid discharge.  Moist mucous membranes, pharynx without inflammation nor lesions.  Neck supple without palpable lymphadenopathy nor abnormal masses. Lungs: Clear to auscultation bilaterally, no wheezing/ronchi/rales.  Comfortable work of breathing. Good air movement. Neuro: Cranial nerves II through XII grossly intact Extremities: No peripheral edema.  Strong peripheral pulses.  Mental Status: No depression, anxiety, nor agitation. Skin: Warm and dry.  Assessment & Plan: Casey Baird was seen today for headache.  Diagnoses and associated orders for this visit:  Allergic sinusitis - montelukast (SINGULAIR) 10 MG tablet; Take 1 tablet (10 mg total) by mouth every morning.  Anxiety and depression    Depression: Uncontrolled, we discussed considering Abilify however he tells me that the skull suicidal thoughts in the past. Discussed increasing Lexapro however he feels that the sedation he received with this in the past is not worth the possible benefit for depressive symptoms. He would prefer to focus on treating his allergic sinusitis as he feels that if he physically felt better he would psychologically feel better as well Allergic sinusitis: Uncontrolled start Singulair continue Q. nasal and pseudoephedrine and Allegra   Return in about 3 months (around 04/29/2013).

## 2013-03-21 ENCOUNTER — Ambulatory Visit (INDEPENDENT_AMBULATORY_CARE_PROVIDER_SITE_OTHER): Payer: PRIVATE HEALTH INSURANCE | Admitting: Family Medicine

## 2013-03-21 ENCOUNTER — Encounter: Payer: Self-pay | Admitting: Family Medicine

## 2013-03-21 VITALS — BP 126/78 | HR 72 | Temp 97.8°F | Wt 184.0 lb

## 2013-03-21 DIAGNOSIS — J329 Chronic sinusitis, unspecified: Secondary | ICD-10-CM

## 2013-03-21 DIAGNOSIS — F329 Major depressive disorder, single episode, unspecified: Secondary | ICD-10-CM

## 2013-03-21 DIAGNOSIS — F419 Anxiety disorder, unspecified: Secondary | ICD-10-CM

## 2013-03-21 DIAGNOSIS — B9689 Other specified bacterial agents as the cause of diseases classified elsewhere: Secondary | ICD-10-CM

## 2013-03-21 DIAGNOSIS — F341 Dysthymic disorder: Secondary | ICD-10-CM

## 2013-03-21 DIAGNOSIS — A499 Bacterial infection, unspecified: Secondary | ICD-10-CM

## 2013-03-21 DIAGNOSIS — J039 Acute tonsillitis, unspecified: Secondary | ICD-10-CM

## 2013-03-21 MED ORDER — CLONAZEPAM 0.5 MG PO TABS: 0.5000 mg | ORAL_TABLET | Freq: Two times a day (BID) | ORAL | Status: DC | PRN

## 2013-03-21 MED ORDER — CEFDINIR 300 MG PO CAPS: 300.0000 mg | ORAL_CAPSULE | Freq: Two times a day (BID) | ORAL | Status: AC

## 2013-03-21 NOTE — Progress Notes (Signed)
CC: Casey Baird is a 22 y.o. male is here for bodyaches and Nausea   Subjective: HPI:  Complains of facial pain localized to the forehead bilaterally along with thick nasal congestion both of which are moderate in severity worsening on a daily basis since onset early last week. Worse first thing in the morning and in the late evening hours. No benefit from over-the-counter cough and cold medicine. Accompanied by low-grade subjective fever fatigue and body aches. Nothing else particularly makes better or worse. Denies cough, wheezing, shortness of breath, chest pain, motor or sensory disturbances, nor rash.  He is requesting refill on clonazepam which she continues to take most but not all days of the week.  Denies decline in anxiety or any new depressive symptoms.  Requesting referral for consideration of tonsillectomy   Review Of Systems Outlined In HPI  Past Medical History  Diagnosis Date  . Anxiety and depression 04/12/2012  . Chronic sinusitis 04/12/2012     Family History  Problem Relation Age of Onset  . Alcohol abuse Maternal Grandfather   . Depression    . Diabetes Maternal Uncle      History  Substance Use Topics  . Smoking status: Never Smoker   . Smokeless tobacco: Not on file  . Alcohol Use: 1.0 oz/week    2 drink(s) per week     Objective: Filed Vitals:   03/21/13 1127  BP: 126/78  Pulse: 72  Temp: 97.8 F (36.6 C)    General: Alert and Oriented, No Acute Distress HEENT: Pupils equal, round, reactive to light. Conjunctivae clear.  External ears unremarkable, canals clear with intact TMs with appropriate landmarks.  Middle ear appears open without effusion. Boggy erythematous and returns with moderate mucoid discharge.  Moist mucous membranes, pharynx without inflammation nor lesions.  Neck supple without palpable lymphadenopathy nor abnormal masses. Unremarkable tonsils bilaterally frontal sinus tenderness to percussion bilaterally Lungs: Clear to  auscultation bilaterally, no wheezing/ronchi/rales.  Comfortable work of breathing. Good air movement. Extremities: No peripheral edema.  Strong peripheral pulses.  Mental Status: No depression, anxiety, nor agitation. Skin: Warm and dry.  Assessment & Plan: [name] was seen today for bodyaches and nausea.  Diagnoses and associated orders for this visit:  Bacterial sinusitis - cefdinir (OMNICEF) 300 MG capsule; Take 1 capsule (300 mg total) by mouth 2 (two) times daily.  Tonsillitis - Ambulatory referral to ENT  Anxiety and depression - clonazePAM (KLONOPIN) 0.5 MG tablet; Take 1 tablet (0.5 mg total) by mouth 2 (two) times daily as needed.    Bacterial sinusitis: Reports intolerance to Augmentin therefore start Omnicef consider up zoster: Sinus for symptom control  Anxiety and depression: Stable continue as needed Klonopin and daily Lexapro  Tonsillitis: Per patient request referral to ENT  25 minutes spent face-to-face during visit today of which at least 50% was counseling or coordinating care regarding  Bacterial sinusitis, tonsillitis, anxiety and depression.   Return if symptoms worsen or fail to improve.

## 2013-04-18 ENCOUNTER — Encounter: Payer: Self-pay | Admitting: Family Medicine

## 2013-04-18 DIAGNOSIS — Z9089 Acquired absence of other organs: Secondary | ICD-10-CM | POA: Insufficient documentation

## 2013-05-09 ENCOUNTER — Ambulatory Visit: Payer: PRIVATE HEALTH INSURANCE | Admitting: Family Medicine

## 2013-05-16 ENCOUNTER — Encounter: Payer: Self-pay | Admitting: Family Medicine

## 2013-05-16 ENCOUNTER — Ambulatory Visit (INDEPENDENT_AMBULATORY_CARE_PROVIDER_SITE_OTHER): Payer: PRIVATE HEALTH INSURANCE | Admitting: Family Medicine

## 2013-05-16 VITALS — BP 129/72 | HR 75 | Wt 182.0 lb

## 2013-05-16 DIAGNOSIS — Z9089 Acquired absence of other organs: Secondary | ICD-10-CM

## 2013-05-16 DIAGNOSIS — F341 Dysthymic disorder: Secondary | ICD-10-CM

## 2013-05-16 DIAGNOSIS — F419 Anxiety disorder, unspecified: Secondary | ICD-10-CM

## 2013-05-16 DIAGNOSIS — F329 Major depressive disorder, single episode, unspecified: Secondary | ICD-10-CM

## 2013-05-16 DIAGNOSIS — Z9889 Other specified postprocedural states: Secondary | ICD-10-CM

## 2013-05-16 MED ORDER — ESCITALOPRAM OXALATE 20 MG PO TABS: 10.0000 mg | ORAL_TABLET | Freq: Every day | ORAL | Status: DC

## 2013-05-16 MED ORDER — CLONAZEPAM 0.5 MG PO TABS: 0.5000 mg | ORAL_TABLET | Freq: Two times a day (BID) | ORAL | Status: DC | PRN

## 2013-05-16 NOTE — Progress Notes (Signed)
CC: Casey Baird is a 23 y.o. male is here for Anxiety and Depression   Subjective: HPI:  Followup anxiety and depression: Continues to take Lexapro a daily basis and takes Klonopin only on days that he is working. States that both anxiety and depression are nonexistent on this regimen however anxiety returns for mild to moderate degree if he forgets to take Klonopin before going to work. Denies mental disturbance, paranoia, difficulty sleeping, nor sedation. No alcohol recreational drug use or tobacco use  Like to discuss his recovery process from tonsillectomy. Reports he had difficulty eating and drinking due to pain with swallowing for the first week afterwards he was able to eat and drink whatever he wanted without difficulty. He reports mild fatigue that has been drastically improving on a weekly basis since the surgery he believes he is 95% better overall but feels that he is sleeping better than he ever has now compared to before the surgery. He is no longer awakening with subjective postnasal drip.   Review Of Systems Outlined In HPI  Past Medical History  Diagnosis Date  . Anxiety and depression 04/12/2012  . Chronic sinusitis 04/12/2012    No past surgical history on file. Family History  Problem Relation Age of Onset  . Alcohol abuse Maternal Grandfather   . Depression    . Diabetes Maternal Uncle     History   Social History  . Marital Status: Single    Spouse Name: N/A    Number of Children: N/A  . Years of Education: N/A   Occupational History  . Not on file.   Social History Main Topics  . Smoking status: Never Smoker   . Smokeless tobacco: Not on file  . Alcohol Use: 1.0 oz/week    2 drink(s) per week  . Drug Use: No  . Sexual Activity: Yes   Other Topics Concern  . Not on file   Social History Narrative  . No narrative on file     Objective: BP 129/72  Pulse 75  Wt 182 lb (82.555 kg)  General: Alert and Oriented, No Acute Distress HEENT: Pupils  equal, round, reactive to light. Conjunctivae clear.  External ears unremarkable, canals clear with intact TMs with appropriate landmarks.  Middle ear appears open without effusion. Pink inferior turbinates.  Moist mucous membranes, pharynx without inflammation nor lesions.  Neck supple without palpable lymphadenopathy nor abnormal masses. Lungs: Clear to auscultation bilaterally, no wheezing/ronchi/rales.  Comfortable work of breathing. Good air movement. Extremities: No peripheral edema.  Strong peripheral pulses.  Mental Status: No depression, anxiety, nor agitation. Skin: Warm and dry.  Assessment & Plan: Casey Baird was seen today for anxiety and depression.  Diagnoses and associated orders for this visit:  S/P tonsillectomy  Anxiety and depression - clonazePAM (KLONOPIN) 0.5 MG tablet; Take 1 tablet (0.5 mg total) by mouth 2 (two) times daily as needed. - escitalopram (LEXAPRO) 20 MG tablet; Take 0.5 tablets (10 mg total) by mouth daily.    Status post tonsillectomy: Reassurance provided that he is experiencing a normal recovery time  Anxiety and depression: Controlled continue as needed Klonopin and daily Lexapro 25 minutes spent face-to-face during visit today of which at least 50% was counseling or coordinating care regarding: 1. S/P tonsillectomy   2. Anxiety and depression        Return in about 6 months (around 11/13/2013).

## 2013-05-26 ENCOUNTER — Encounter: Payer: Self-pay | Admitting: Family Medicine

## 2013-05-26 ENCOUNTER — Ambulatory Visit (INDEPENDENT_AMBULATORY_CARE_PROVIDER_SITE_OTHER): Payer: PRIVATE HEALTH INSURANCE | Admitting: Family Medicine

## 2013-05-26 VITALS — BP 123/70 | HR 86 | Temp 98.2°F | Wt 188.0 lb

## 2013-05-26 DIAGNOSIS — F32A Depression, unspecified: Secondary | ICD-10-CM

## 2013-05-26 DIAGNOSIS — F341 Dysthymic disorder: Secondary | ICD-10-CM

## 2013-05-26 DIAGNOSIS — F419 Anxiety disorder, unspecified: Secondary | ICD-10-CM

## 2013-05-26 DIAGNOSIS — J329 Chronic sinusitis, unspecified: Secondary | ICD-10-CM

## 2013-05-26 DIAGNOSIS — F329 Major depressive disorder, single episode, unspecified: Secondary | ICD-10-CM

## 2013-05-26 DIAGNOSIS — B9789 Other viral agents as the cause of diseases classified elsewhere: Secondary | ICD-10-CM

## 2013-05-26 NOTE — Progress Notes (Signed)
CC: Casey Baird is a 23 y.o. male is here for Sinusitis   Subjective: HPI:  Complains of pressure in the face localized to both eyes it is moderate in severity worse first thing in the morning and improves as the day progresses. Accompanied by mild nasal congestion with mild subjective postnasal drip. Symptoms were bad enough where he was worried to go into work today. Denies fevers, chills, nausea, vomiting, motor sensory disturbances, ear pain cough or shortness of breath.  He's requesting a referral for counseling for anxiety and depression but is happy with current medication regimen  Review Of Systems Outlined In HPI  Past Medical History  Diagnosis Date  . Anxiety and depression 04/12/2012  . Chronic sinusitis 04/12/2012    No past surgical history on file. Family History  Problem Relation Age of Onset  . Alcohol abuse Maternal Grandfather   . Depression    . Diabetes Maternal Uncle     History   Social History  . Marital Status: Single    Spouse Name: N/A    Number of Children: N/A  . Years of Education: N/A   Occupational History  . Not on file.   Social History Main Topics  . Smoking status: Never Smoker   . Smokeless tobacco: Not on file  . Alcohol Use: 1.0 oz/week    2 drink(s) per week  . Drug Use: No  . Sexual Activity: Yes   Other Topics Concern  . Not on file   Social History Narrative  . No narrative on file     Objective: BP 123/70  Pulse 86  Temp(Src) 98.2 F (36.8 C) (Oral)  Wt 188 lb (85.276 kg)  General: Alert and Oriented, No Acute Distress HEENT: Pupils equal, round, reactive to light. Conjunctivae clear.  External ears unremarkable, canals clear with intact TMs with appropriate landmarks.  Middle ear appears open without effusion. Pink inferior turbinates.  Moist mucous membranes, pharynx without inflammation nor lesions.  Neck supple without palpable lymphadenopathy nor abnormal masses. Lungs: Clear to auscultation bilaterally, no  wheezing/ronchi/rales.  Comfortable work of breathing. Good air movement. Mental Status: No depression, anxiety, nor agitation. Skin: Warm and dry.  Assessment & Plan: [name] was seen today for sinusitis.  Diagnoses and associated orders for this visit:  Viral sinusitis  Anxiety and depression - Ambulatory referral to Psychology    Viral sinusitis: Discussed symptomatic care with Alka-Seltzer cold and sinus nasal saline washes and watchful waiting.  If symptoms persist close to 2 weeks Will then consider antibiotics. Referral placed per patient request  Return if symptoms worsen or fail to improve.

## 2013-08-26 ENCOUNTER — Encounter: Payer: Self-pay | Admitting: Family Medicine

## 2013-08-26 ENCOUNTER — Ambulatory Visit (INDEPENDENT_AMBULATORY_CARE_PROVIDER_SITE_OTHER): Payer: PRIVATE HEALTH INSURANCE | Admitting: Family Medicine

## 2013-08-26 VITALS — BP 117/67 | HR 60 | Temp 97.8°F | Wt 182.0 lb

## 2013-08-26 DIAGNOSIS — J329 Chronic sinusitis, unspecified: Secondary | ICD-10-CM

## 2013-08-26 DIAGNOSIS — B9689 Other specified bacterial agents as the cause of diseases classified elsewhere: Secondary | ICD-10-CM

## 2013-08-26 DIAGNOSIS — A499 Bacterial infection, unspecified: Secondary | ICD-10-CM

## 2013-08-26 MED ORDER — DOXYCYCLINE HYCLATE 100 MG PO TABS: ORAL_TABLET | ORAL | Status: AC

## 2013-08-26 NOTE — Progress Notes (Signed)
CC: Casey Baird is a 23 y.o. male is here for Sore Throat, Headache and Generalized Body Aches   Subjective: HPI:  Complains of postnasal drip, sore throat, fatigue, nasal congestion that has been present for the past 3 days, mild in severity. No interventions as of yet. Does not seem to improve with Allegra nor Singulair.  Symptoms are worsening on a daily basis. Accompanied by subjective fever and chills this morning but no objective fever. Symptoms are persistent throughout the day, he felt that symptoms were worse enough today to  where he could not make it to work. Denies cough, wheezing, chest pain, confusion, sinus pressure, nausea, vomiting, nor joint pain he denies rash   Review Of Systems Outlined In HPI  Past Medical History  Diagnosis Date  . Anxiety and depression 04/12/2012  . Chronic sinusitis 04/12/2012    No past surgical history on file. Family History  Problem Relation Age of Onset  . Alcohol abuse Maternal Grandfather   . Depression    . Diabetes Maternal Uncle     History   Social History  . Marital Status: Single    Spouse Name: N/A    Number of Children: N/A  . Years of Education: N/A   Occupational History  . Not on file.   Social History Main Topics  . Smoking status: Never Smoker   . Smokeless tobacco: Not on file  . Alcohol Use: 1.0 oz/week    2 drink(s) per week  . Drug Use: No  . Sexual Activity: Yes   Other Topics Concern  . Not on file   Social History Narrative  . No narrative on file     Objective: BP 117/67  Pulse 60  Temp(Src) 97.8 F (36.6 C) (Oral)  Wt 182 lb (82.555 kg)  General: Alert and Oriented, No Acute Distress HEENT: Pupils equal, round, reactive to light. Conjunctivae clear.  External ears unremarkable, canals clear with intact TMs with appropriate landmarks.  Middle ear appears open without effusion. Pink inferior turbinates.  Moist mucous membranes, pharynx without inflammation nor lesions however moderate  cobblestoning and postnasal drip .  Neck supple without palpable lymphadenopathy nor abnormal masses. Lungs: Clear to auscultation bilaterally, no wheezing/ronchi/rales.  Comfortable work of breathing. Good air movement. Cardiac: Regular rate and rhythm. Normal S1/S2.  No murmurs, rubs, nor gallops.   Mental Status: No depression, anxiety, nor agitation. Skin: Warm and dry.  Assessment & Plan: Casey Baird was seen today for sore throat, headache and generalized body aches.  Diagnoses and associated orders for this visit:  Bacterial sinusitis - doxycycline (VIBRA-TABS) 100 MG tablet; One by mouth twice a day for ten days.    Discussed that his story and symptoms are most suggestive of a viral URI and should resolve within 7 days of onset. Symptom control with nasal saline washes, Alka-Seltzer cold and sinus, and no need to start doxycycline and less fevers occur or facial pressure in the sinus region occurs beyond 7 days of initial onset of illness.  Return if symptoms worsen or fail to improve.

## 2013-09-22 ENCOUNTER — Other Ambulatory Visit: Payer: Self-pay | Admitting: Physician Assistant

## 2013-10-24 ENCOUNTER — Ambulatory Visit: Payer: PRIVATE HEALTH INSURANCE | Admitting: Family Medicine

## 2013-12-29 ENCOUNTER — Other Ambulatory Visit: Payer: Self-pay | Admitting: Family Medicine

## 2014-04-10 ENCOUNTER — Ambulatory Visit (INDEPENDENT_AMBULATORY_CARE_PROVIDER_SITE_OTHER): Payer: PRIVATE HEALTH INSURANCE | Admitting: Family Medicine

## 2014-04-10 ENCOUNTER — Encounter: Payer: Self-pay | Admitting: Family Medicine

## 2014-04-10 VITALS — BP 129/80 | HR 85 | Temp 97.5°F | Ht 73.0 in | Wt 186.0 lb

## 2014-04-10 DIAGNOSIS — F419 Anxiety disorder, unspecified: Secondary | ICD-10-CM

## 2014-04-10 DIAGNOSIS — J320 Chronic maxillary sinusitis: Secondary | ICD-10-CM

## 2014-04-10 DIAGNOSIS — F418 Other specified anxiety disorders: Secondary | ICD-10-CM

## 2014-04-10 DIAGNOSIS — F329 Major depressive disorder, single episode, unspecified: Secondary | ICD-10-CM

## 2014-04-10 DIAGNOSIS — J329 Chronic sinusitis, unspecified: Secondary | ICD-10-CM

## 2014-04-10 DIAGNOSIS — F32A Depression, unspecified: Secondary | ICD-10-CM

## 2014-04-10 DIAGNOSIS — B9689 Other specified bacterial agents as the cause of diseases classified elsewhere: Secondary | ICD-10-CM

## 2014-04-10 DIAGNOSIS — A499 Bacterial infection, unspecified: Secondary | ICD-10-CM

## 2014-04-10 MED ORDER — CLONAZEPAM 0.5 MG PO TABS: 0.5000 mg | ORAL_TABLET | Freq: Two times a day (BID) | ORAL | Status: DC | PRN

## 2014-04-10 MED ORDER — ESCITALOPRAM OXALATE 20 MG PO TABS: 10.0000 mg | ORAL_TABLET | Freq: Every day | ORAL | Status: DC

## 2014-04-10 MED ORDER — LEVOFLOXACIN 500 MG PO TABS: 500.0000 mg | ORAL_TABLET | Freq: Every day | ORAL | Status: DC

## 2014-04-10 MED ORDER — BECLOMETHASONE DIPROPIONATE 80 MCG/ACT NA AERS: INHALATION_SPRAY | NASAL | Status: DC

## 2014-04-10 NOTE — Progress Notes (Signed)
CC: Casey Baird is a 24 y.o. male is here for URI   Subjective: HPI:  Requesting refills on Lexapro and clonazepam. Has been taking Lexapro daily and clonazepam twice a day. He denies any noted intolerance or side effects. 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 Denies any subjective anxiety or depression or any mental disturbance.  Requesting refills on Qnasl. He takes this on a daily basis and reports that it has significantly reduced his nasal congestion and sinus pressure other than a recent illness described below. Denies bloody nose or chronic cough.  Complains of facial pressure localized between the eyes and in the forehead that has been present for the last week. It seems to be worsening ever since the weekend. It is worse with leaning forward. Accompanied by nasal congestion postnasal drip and nonproductive cough. Slight benefit from NyQuil and DayQuil no other interventions as of yet. Accompanied by dizziness when going from a leaning forward to standing position. Denies any motor or sensory disturbances other than that described above, fevers, chills, wheezing, shortness of breath  Review Of Systems Outlined In HPI  Past Medical History  Diagnosis Date  . Anxiety and depression 04/12/2012  . Chronic sinusitis 04/12/2012    No past surgical history on file. Family History  Problem Relation Age of Onset  . Alcohol abuse Maternal Grandfather   . Depression    . Diabetes Maternal Uncle     History   Social History  . Marital Status: Single    Spouse Name: N/A    Number of Children: N/A  . Years of Education: N/A   Occupational History  . Not on file.   Social History Main Topics  . Smoking status: Never Smoker   . Smokeless tobacco: Not on file  . Alcohol Use: 1.0 oz/week    2 drink(s) per week  . Drug Use: No  .  Sexual Activity: Yes   Other Topics Concern  . Not on file   Social History Narrative     Objective: BP 129/80 mmHg  Pulse 85  Temp(Src) 97.5 F (36.4 C) (Oral)  Ht 6\' 1"  (1.854 m)  Wt 186 lb (84.369 kg)  BMI 24.55 kg/m2  General: Alert and Oriented, No Acute Distress HEENT: Pupils equal, round, reactive to light. Conjunctivae clear.  External ears unremarkable, canals clear with intact TMs with appropriate landmarks.  Middle ear appears open without effusion. Pink inferior turbinates with moderate mucoid discharge.  Moist mucous membranes, pharynx with moderate cobblestoning and postnasal drip.  Neck supple without palpable lymphadenopathy nor abnormal masses. Lungs: Clear to auscultation bilaterally, no wheezing/ronchi/rales.  Comfortable work of breathing. Good air movement. Extremities: No peripheral edema.  Strong peripheral pulses.  Mental Status: No depression, anxiety, nor agitation. Skin: Warm and dry.  Assessment & Plan: Casey Baird was seen today for uri.  Diagnoses and associated orders for this visit:  Bacterial sinusitis - levofloxacin (LEVAQUIN) 500 MG tablet; Take 1 tablet (500 mg total) by mouth daily.  Anxiety and depression - escitalopram (LEXAPRO) 20 MG tablet; Take 0.5 tablets (10 mg total) by mouth daily.  Chronic maxillary sinusitis  Other Orders - clonazePAM (KLONOPIN) 0.5 MG tablet; Take 1 tablet (0.5 mg total) by mouth 2 (two) times daily as needed. - Beclomethasone Dipropionate (QNASL) 80 MCG/ACT AERS; USE TWO SPRAYS into the nose daily  Bacterial sinusitis: Start levofloxacin consider DayQuil and NyQuil Anxiety depression: Controlled continue Lexapro and as needed clonazepam Chronic sinusitis: Controlled from an allergy standpoint, continue Qnasl  Return in about 3 months (around 07/10/2014) for Mood.

## 2014-10-27 ENCOUNTER — Other Ambulatory Visit: Payer: Self-pay | Admitting: Family Medicine

## 2014-12-08 ENCOUNTER — Ambulatory Visit (INDEPENDENT_AMBULATORY_CARE_PROVIDER_SITE_OTHER): Payer: BLUE CROSS/BLUE SHIELD | Admitting: Family Medicine

## 2014-12-08 ENCOUNTER — Encounter: Payer: Self-pay | Admitting: Family Medicine

## 2014-12-08 VITALS — BP 129/72 | HR 71 | Temp 98.0°F | Wt 183.0 lb

## 2014-12-08 DIAGNOSIS — J069 Acute upper respiratory infection, unspecified: Secondary | ICD-10-CM | POA: Diagnosis not present

## 2014-12-08 MED ORDER — PREDNISONE 20 MG PO TABS: ORAL_TABLET | ORAL | Status: AC

## 2014-12-08 NOTE — Progress Notes (Signed)
CC: Casey Baird is a 24 y.o. male is here for URI   Subjective: HPI:  Nasal congestion, sore throa, myalgias t and fatigue that has been present for the past 2 days. Symptoms are bad enough where he just wants to sleep around the house. Mild benefit from DayQuil. His symptoms are mild to moderate in severity. His lost his appetite little bit but has no fevers, chills, abdominal pain nor chest discomfort. He has a nonproductive cough but mild in severity. Symptoms are not getting better or worse since onset. Symptoms can occur anytime of the day. Denies headache, rash, nor joint pain.   Review Of Systems Outlined In HPI  Past Medical History  Diagnosis Date  . Anxiety and depression 04/12/2012  . Chronic sinusitis 04/12/2012    No past surgical history on file. Family History  Problem Relation Age of Onset  . Alcohol abuse Maternal Grandfather   . Depression    . Diabetes Maternal Uncle     Social History   Social History  . Marital Status: Single    Spouse Name: N/A  . Number of Children: N/A  . Years of Education: N/A   Occupational History  . Not on file.   Social History Main Topics  . Smoking status: Never Smoker   . Smokeless tobacco: Not on file  . Alcohol Use: 1.0 oz/week    2 drink(s) per week  . Drug Use: No  . Sexual Activity: Yes   Other Topics Concern  . Not on file   Social History Narrative     Objective: BP 129/72 mmHg  Pulse 71  Temp(Src) 98 F (36.7 C) (Oral)  Wt 183 lb (83.008 kg)  General: Alert and Oriented, No Acute Distress HEENT: Pupils equal, round, reactive to light. Conjunctivae clear.  External ears unremarkable, canals clear with intact TMs with appropriate landmarks.  Middle ear appears open without effusion. Pink inferior turbinates.  Moist mucous membranes, pharynx without inflammation nor lesions.  Neck supple without palpable lymphadenopathy nor abnormal masses. Lungs: Clear to auscultation bilaterally, no wheezing/ronchi/rales.   Comfortable work of breathing. Good air movement. Cardiac: Regular rate and rhythm. Normal S1/S2.  No murmurs, rubs, nor gallops.   Extremities: No peripheral edema.  Strong peripheral pulses.  Mental Status: No depression, anxiety, nor agitation. Skin: Warm and dry.  Assessment & Plan: Casey Baird was seen today for uri.  Diagnoses and all orders for this visit:  Viral URI -     predniSONE (DELTASONE) 20 MG tablet; Three tabs at once daily for five days.   Viral URI: Continue DayQuil, consider prednisone to help with resolution of symptoms especially myalgias and sore throat.Signs and symptoms requring emergent/urgent reevaluation were discussed with the patient. REturn to work on Monday  Return if symptoms worsen or fail to improve.

## 2015-01-31 ENCOUNTER — Other Ambulatory Visit: Payer: Self-pay | Admitting: Family Medicine

## 2015-02-02 NOTE — Telephone Encounter (Signed)
Casey Baird, Rx placed in in-box ready for pickup/faxing.  

## 2015-03-30 ENCOUNTER — Telehealth: Payer: Self-pay

## 2015-03-30 DIAGNOSIS — F419 Anxiety disorder, unspecified: Principal | ICD-10-CM

## 2015-03-30 DIAGNOSIS — F329 Major depressive disorder, single episode, unspecified: Secondary | ICD-10-CM

## 2015-03-30 MED ORDER — ESCITALOPRAM OXALATE 20 MG PO TABS: 10.0000 mg | ORAL_TABLET | Freq: Every day | ORAL | Status: DC

## 2015-03-30 MED ORDER — CLONAZEPAM 0.5 MG PO TABS: 0.5000 mg | ORAL_TABLET | Freq: Two times a day (BID) | ORAL | Status: DC | PRN

## 2015-03-30 NOTE — Telephone Encounter (Signed)
Yes this is ok, I typically only require 6 month follow ups for this.

## 2015-03-30 NOTE — Telephone Encounter (Signed)
Pt is requesting refill on lexapro.  He hasn't been since last September.  Is this refill appropriate?

## 2015-03-30 NOTE — Telephone Encounter (Signed)
Pt.notified

## 2015-04-03 ENCOUNTER — Ambulatory Visit (INDEPENDENT_AMBULATORY_CARE_PROVIDER_SITE_OTHER): Payer: BLUE CROSS/BLUE SHIELD | Admitting: Family Medicine

## 2015-04-03 ENCOUNTER — Encounter: Payer: Self-pay | Admitting: Family Medicine

## 2015-04-03 VITALS — BP 122/76 | HR 60 | Wt 192.0 lb

## 2015-04-03 DIAGNOSIS — F418 Other specified anxiety disorders: Secondary | ICD-10-CM

## 2015-04-03 DIAGNOSIS — F32A Depression, unspecified: Secondary | ICD-10-CM

## 2015-04-03 DIAGNOSIS — Z23 Encounter for immunization: Secondary | ICD-10-CM

## 2015-04-03 DIAGNOSIS — F419 Anxiety disorder, unspecified: Principal | ICD-10-CM

## 2015-04-03 DIAGNOSIS — F329 Major depressive disorder, single episode, unspecified: Secondary | ICD-10-CM

## 2015-04-03 MED ORDER — CLONAZEPAM 0.5 MG PO TABS: 0.5000 mg | ORAL_TABLET | Freq: Two times a day (BID) | ORAL | Status: DC | PRN

## 2015-04-03 MED ORDER — ESCITALOPRAM OXALATE 20 MG PO TABS: 10.0000 mg | ORAL_TABLET | Freq: Every day | ORAL | Status: DC

## 2015-04-03 NOTE — Progress Notes (Signed)
CC: Casey Baird is a 25 y.o. male is here for Medication Refill   Subjective: HPI:  Follow-up anxiety and depression: takes Lexapro daily basis. Uses only a single dose of clonazepam most days of the week if he's feeling anxious. He tells me that he has a mild degree of depression this time of the year when it's snows and he feels stuck inside.  Symptoms are not interfering with quality of life. He denies any thoughts of wanting to harm self or others. Overall he is very happy with his current medication regimen. No difficulty falling asleep. He is not certain about what causes his anxiety.   Review Of Systems Outlined In HPI  Past Medical History  Diagnosis Date  . Anxiety and depression 04/12/2012  . Chronic sinusitis 04/12/2012    No past surgical history on file. Family History  Problem Relation Age of Onset  . Alcohol abuse Maternal Grandfather   . Depression    . Diabetes Maternal Uncle     Social History   Social History  . Marital Status: Single    Spouse Name: N/A  . Number of Children: N/A  . Years of Education: N/A   Occupational History  . Not on file.   Social History Main Topics  . Smoking status: Never Smoker   . Smokeless tobacco: Not on file  . Alcohol Use: 1.0 oz/week    2 drink(s) per week  . Drug Use: No  . Sexual Activity: Yes   Other Topics Concern  . Not on file   Social History Narrative     Objective: BP 122/76 mmHg  Pulse 60  Wt 192 lb (87.091 kg)  Vital signs reviewed. General: Alert and Oriented, No Acute Distress HEENT: Pupils equal, round, reactive to light. Conjunctivae clear.  External ears unremarkable.  Moist mucous membranes. Lungs: Clear and comfortable work of breathing, speaking in full sentences without accessory muscle use. Cardiac: Regular rate and rhythm.  Neuro: CN II-XII grossly intact, gait normal. Extremities: No peripheral edema.  Strong peripheral pulses.  Mental Status: No depression, anxiety, nor agitation.  Logical though process. Skin: Warm and dry.  Assessment & Plan: [name] was seen today for medication refill.  Diagnoses and all orders for this visit:  Anxiety and depression -     escitalopram (LEXAPRO) 20 MG tablet; Take 0.5 tablets (10 mg total) by mouth daily. -     clonazePAM (KLONOPIN) 0.5 MG tablet; Take 1 tablet (0.5 mg total) by mouth 2 (two) times daily as needed.   Anxiety and depression are controlled therefore continue current Lexapro and clonazepam regimen.   Return in about 6 months (around 10/01/2015).

## 2015-05-06 ENCOUNTER — Other Ambulatory Visit: Payer: Self-pay | Admitting: Family Medicine

## 2015-08-21 ENCOUNTER — Ambulatory Visit (INDEPENDENT_AMBULATORY_CARE_PROVIDER_SITE_OTHER): Payer: BLUE CROSS/BLUE SHIELD | Admitting: Family Medicine

## 2015-08-21 ENCOUNTER — Encounter: Payer: Self-pay | Admitting: Family Medicine

## 2015-08-21 VITALS — BP 115/77 | HR 80 | Temp 98.1°F | Wt 192.0 lb

## 2015-08-21 DIAGNOSIS — A499 Bacterial infection, unspecified: Secondary | ICD-10-CM | POA: Diagnosis not present

## 2015-08-21 DIAGNOSIS — J329 Chronic sinusitis, unspecified: Secondary | ICD-10-CM

## 2015-08-21 DIAGNOSIS — B9689 Other specified bacterial agents as the cause of diseases classified elsewhere: Secondary | ICD-10-CM

## 2015-08-21 MED ORDER — LEVOFLOXACIN 500 MG PO TABS: 500.0000 mg | ORAL_TABLET | Freq: Every day | ORAL | Status: DC

## 2015-08-21 MED ORDER — BECLOMETHASONE DIPROPIONATE 80 MCG/ACT NA AERS: INHALATION_SPRAY | NASAL | Status: DC

## 2015-08-21 NOTE — Progress Notes (Signed)
CC: Casey Baird is a 25 y.o. male is here for Nasal Congestion   Subjective: HPI:  Facial pressure in the cheeks, nasal congestion and postnasal drip. Symptoms have been present for a week now. They were coming by a cough and fatigue however this resolved about 3 days ago. No benefit from over-the-counter cough and cold medication nor allergy meds. He denies fevers, chills, wheezing, shortness of breath, nor photophobia. Symptoms are mild in severity but worsening on a daily basis   Review Of Systems Outlined In HPI  Past Medical History  Diagnosis Date  . Anxiety and depression 04/12/2012  . Chronic sinusitis 04/12/2012    No past surgical history on file. Family History  Problem Relation Age of Onset  . Alcohol abuse Maternal Grandfather   . Depression    . Diabetes Maternal Uncle     Social History   Social History  . Marital Status: Single    Spouse Name: N/A  . Number of Children: N/A  . Years of Education: N/A   Occupational History  . Not on file.   Social History Main Topics  . Smoking status: Never Smoker   . Smokeless tobacco: Not on file  . Alcohol Use: 1.0 oz/week    2 drink(s) per week  . Drug Use: No  . Sexual Activity: Yes   Other Topics Concern  . Not on file   Social History Narrative     Objective: BP 115/77 mmHg  Pulse 80  Temp(Src) 98.1 F (36.7 C) (Oral)  Wt 192 lb (87.091 kg)  General: Alert and Oriented, No Acute Distress HEENT: Pupils equal, round, reactive to light. Conjunctivae clear.  External ears unremarkable, canals clear with intact TMs with appropriate landmarks.  Middle ear appears open without effusion. Pink inferior turbinates.  Moist mucous membranes, pharynx without inflammation nor lesions Other than postnasal drip and cobblestoning.  Neck supple without palpable lymphadenopathy nor abnormal masses. Lungs: Clear to auscultation bilaterally, no wheezing/ronchi/rales.  Comfortable work of breathing. Good air  movement. Extremities: No peripheral edema.  Strong peripheral pulses.  Mental Status: No depression, anxiety, nor agitation. Skin: Warm and dry.  Assessment & Plan: Casey Baird was seen today for nasal congestion.  Diagnoses and all orders for this visit:  Bacterial sinusitis -     Beclomethasone Dipropionate (QNASL) 80 MCG/ACT AERS; USE TWO SPRAYS into the nose daily -     levofloxacin (LEVAQUIN) 500 MG tablet; Take 1 tablet (500 mg total) by mouth daily.   Start the above, he would like a refill on Qnasl which seems reasonable. Also encouraged to consider nasal saline washes  Return if symptoms worsen or fail to improve.

## 2015-08-24 ENCOUNTER — Telehealth: Payer: Self-pay | Admitting: *Deleted

## 2015-08-24 DIAGNOSIS — J320 Chronic maxillary sinusitis: Secondary | ICD-10-CM

## 2015-08-24 NOTE — Telephone Encounter (Signed)
Insurance denied coverage for qnasl. Letter placed in box

## 2015-08-24 NOTE — Telephone Encounter (Signed)
Initiated PA for Qnasl. Key: [id number] - PA Case ID: [id number]  this may be denied since it look as if patient has not tried and failed preferred drugs. List of preferred placed in [facility]'s box.  Would you want to go ahead an prescribe one of the alternatives or wait until hear back from insurance. I see that he was recently in for sinus infection

## 2015-08-24 NOTE — Telephone Encounter (Signed)
I'd like to see if the PA goes through, it has in the past but I wouldn't be surprised if it changed with the new year.

## 2015-08-27 MED ORDER — AZELASTINE-FLUTICASONE 137-50 MCG/ACT NA SUSP
NASAL | Status: DC
Start: 2015-08-27 — End: 2016-12-08

## 2015-08-27 NOTE — Telephone Encounter (Signed)
Pt.notified

## 2015-08-27 NOTE — Telephone Encounter (Signed)
[place], Will you please let patient know that [facility] has denied coverage of Qnasal.  I'd recommend he try a different nasal steroid called Dymista that I sent to [facility] this morning.

## 2016-03-18 ENCOUNTER — Other Ambulatory Visit: Payer: Self-pay

## 2016-03-18 DIAGNOSIS — F419 Anxiety disorder, unspecified: Principal | ICD-10-CM

## 2016-03-18 DIAGNOSIS — F329 Major depressive disorder, single episode, unspecified: Secondary | ICD-10-CM

## 2016-03-18 MED ORDER — CLONAZEPAM 0.5 MG PO TABS: 0.5000 mg | ORAL_TABLET | Freq: Two times a day (BID) | ORAL | 0 refills | Status: DC | PRN

## 2016-03-31 ENCOUNTER — Ambulatory Visit: Payer: BLUE CROSS/BLUE SHIELD | Admitting: Physician Assistant

## 2016-12-08 ENCOUNTER — Encounter: Payer: Self-pay | Admitting: Physician Assistant

## 2016-12-08 ENCOUNTER — Ambulatory Visit (INDEPENDENT_AMBULATORY_CARE_PROVIDER_SITE_OTHER): Payer: BLUE CROSS/BLUE SHIELD | Admitting: Physician Assistant

## 2016-12-08 VITALS — BP 134/72 | HR 78 | Ht 73.0 in | Wt 200.0 lb

## 2016-12-08 DIAGNOSIS — J301 Allergic rhinitis due to pollen: Secondary | ICD-10-CM | POA: Diagnosis not present

## 2016-12-08 DIAGNOSIS — F419 Anxiety disorder, unspecified: Secondary | ICD-10-CM

## 2016-12-08 DIAGNOSIS — F329 Major depressive disorder, single episode, unspecified: Secondary | ICD-10-CM | POA: Diagnosis not present

## 2016-12-08 DIAGNOSIS — Z23 Encounter for immunization: Secondary | ICD-10-CM | POA: Diagnosis not present

## 2016-12-08 MED ORDER — BECLOMETHASONE DIPROPIONATE 80 MCG/ACT NA AERS: INHALATION_SPRAY | NASAL | 12 refills | Status: DC

## 2016-12-08 MED ORDER — CLONAZEPAM 0.5 MG PO TABS: 0.5000 mg | ORAL_TABLET | Freq: Two times a day (BID) | ORAL | 5 refills | Status: DC | PRN

## 2016-12-08 MED ORDER — ESCITALOPRAM OXALATE 10 MG PO TABS: 10.0000 mg | ORAL_TABLET | Freq: Every day | ORAL | 4 refills | Status: DC

## 2016-12-08 NOTE — Patient Instructions (Signed)
Vilazodone oral tablet What is this medicine? VILAZODONE (vil [place] oh done) is used to treat depression. This medicine may be used for other purposes; ask your health care provider or pharmacist if you have questions. COMMON BRAND NAME(S): [id number] What should I tell my health care provider before I take this medicine? They need to know if you have any of these conditions: -bipolar disorder or a family history of bipolar disorder -glaucoma -liver disease -low levels of sodium in the blood -receiving electroconvulsive therapy -seizures (convulsions) -suicidal thoughts, plans, or attempt by you or a family member -an unusual or allergic reaction to vilazodone, other medicines, foods, dyes or preservatives -pregnant or trying to get pregnant -breast-feeding How should I use this medicine? Take this medicine by mouth with a glass of water. Follow the directions on the prescription label. Take this medicine with food. Take your medicine at regular intervals. Do not take your medicine more often than directed. Do not stop taking this medicine suddenly except upon the advice of your doctor. Stopping this medicine too quickly may cause serious side effects or your condition may worsen. A special MedGuide will be given to you by the pharmacist with each prescription and refill. Be sure to read this information carefully each time. Overdosage: If you think you have taken too much of this medicine contact a poison control center or emergency room at once. NOTE: This medicine is only for you. Do not share this medicine with others. What if I miss a dose? If you miss a dose, take it as soon as you can. If it is almost time for your next dose, take only that dose. Do not take double or extra doses. What may interact with this medicine? Do not take this medicine with any of the following medications: -linezolid -MAOIs like Carbex, Eldepryl, Marplan, Nardil, and Parnate -methylene blue (injected into a  vein) This medicine may also interact with the following medications: -amphetamines -aspirin and aspirin-like medicines -buspirone -certain diet drugs like dexfenfluramine, fenfluramine, phentermine, sibutramine -certain migraine headache medicines like almotriptan, eletriptan, frovatriptan, naratriptan, rizatriptan, sumatriptan, zolmitriptan -certain medicines that treat or prevent blood clots like warfarin, enoxaparin, and dalteparin -certain medicines that treat infections like clarithromycin, itraconazole, voriconazole, ketoconazole, rifampin -certain medicines that treat seizures like carbamazepine and phenytoin -digoxin -fentanyl -lithium -NSAIDS, medicines for pain and inflammation, like ibuprofen or naproxen -other medicines for depression, anxiety, or psychotic disturbances -St. John's Wort -tramadol -tryptophan This list may not describe all possible interactions. Give your health care provider a list of all the medicines, herbs, non-prescription drugs, or dietary supplements you use. Also tell them if you smoke, drink alcohol, or use illegal drugs. Some items may interact with your medicine. What should I watch for while using this medicine? Tell your doctor if your symptoms do not get better or if they get worse. Visit your doctor or health care professional for regular checks on your progress. Because it may take several weeks to see the full effects of this medicine, it is important to continue your treatment as prescribed by your doctor. Patients and their families should watch out for new or worsening thoughts of suicide or depression. Also watch out for sudden changes in feelings such as feeling anxious, agitated, panicky, irritable, hostile, aggressive, impulsive, severely restless, overly excited and hyperactive, or not being able to sleep. If this happens, especially at the beginning of treatment or after a change in dose, call your health care professional. You may get  drowsy or dizzy. Do   not drive, use machinery, or do anything that needs mental alertness until you know how this medicine affects you. Do not stand or sit up quickly, especially if you are an older patient. This reduces the risk of dizzy or fainting spells. Alcohol may interfere with the effect of this medicine. Avoid alcoholic drinks. Your mouth may get dry. Chewing sugarless gum or sucking hard candy, and drinking plenty of water may help. Contact your doctor if the problem does not go away or is severe. What side effects may I notice from receiving this medicine? Side effects that you should report to your doctor or health care professional as soon as possible: -allergic reactions like skin rash, itching or hives, swelling of the face, lips, or tongue -anxious -black, tarry stools -changes in vision -confusion -elevated mood, decreased need for sleep, racing thoughts, impulsive behavior -eye pain -fast, irregular heartbeat -feeling faint or lightheaded, falls -feeling agitated, angry, or irritable -hallucination, loss of contact with reality -loss of balance or coordination -loss of memory -restlessness, pacing, inability to keep still -seizures -stiff muscles -suicidal thoughts or other mood changes -trouble sleeping -unusual bleeding or bruising -unusually weak or tired -vomiting Side effects that usually do not require medical attention (report to your doctor or health care professional if they continue or are bothersome): -change in appetite or weight -change in sex drive or performance -diarrhea -drowsiness -dry mouth -increased sweating -nausea -tremors This list may not describe all possible side effects. Call your doctor for medical advice about side effects. You may report side effects to FDA at 1-800-FDA-1088. Where should I keep my medicine? Keep out of the reach of children. Store at room temperature between 15 and 30 degrees C (59 to 86 degrees F). Throw away any  unused medicine after the expiration date. NOTE: This sheet is a summary. It may not cover all possible information. If you have questions about this medicine, talk to your doctor, pharmacist, or health care provider.  2018 Elsevier/Gold Standard (2015-11-06 12:50:48)  

## 2016-12-08 NOTE — Progress Notes (Signed)
   Subjective:    Patient ID: Casey Baird, male    DOB: [birth year], 26 y.o.   MRN: [id number]  HPI Pt is a 26 yo male who presents to the clinic to establish care. He previously saw Dr. Ileene Rubens.   He went to another provider for a while but now wants to stay with our office because he likes [place] better.   Anxiety/depression- controlled with lexapro 10mg  and klonapin up to twice a day. Most days he admits to taking once a day or not at all. Increasing lexapro makes him too fatigued. No SI/HC.   He is on flonase but would like to switch back to qnasl.   .. Active Ambulatory Problems    Diagnosis Date Noted  . Chronic sinusitis 04/12/2012  . Anxiety and depression 04/12/2012  . 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 Calculus, tonsil 07/26/2012  . Hypertriglyceridemia 11/01/2012  . S/P tonsillectomy 04/18/2013  . Seasonal allergic rhinitis due to pollen 12/08/2016   Resolved Ambulatory Problems    Diagnosis Date Noted  . No Resolved Ambulatory Problems   Past Medical History:  Diagnosis Date  . Anxiety and depression 04/12/2012  . Chronic sinusitis 04/12/2012      Review of Systems  All other systems reviewed and are negative.      Objective:   Physical Exam  Constitutional: He is oriented to person, place, and time. He appears well-developed and well-nourished.  HENT:  Head: Normocephalic and atraumatic.  Cardiovascular: Normal rate, regular rhythm and normal heart sounds.   Pulmonary/Chest: Effort normal and breath sounds normal. He has no wheezes.  Neurological: He is alert and oriented to person, place, and time.  Psychiatric: He has a normal mood and affect. His behavior is normal.          Assessment & Plan:  Marland KitchenMarland KitchenDiagnoses and all orders for this visit:  Anxiety and depression -     escitalopram (LEXAPRO) 10 MG tablet; Take 1 tablet (10 mg total) by mouth daily. -     clonazePAM (KLONOPIN) 0.5 MG tablet; Take 1 tablet (0.5 mg total) by mouth 2 (two) times daily as  needed.  Influenza vaccine needed -     Flu Vaccine QUAD 6+ mos PF IM (Fluarix Quad PF)  Seasonal allergic rhinitis due to pollen -     Beclomethasone Dipropionate 80 MCG/ACT AERS; 1 spray each nostril daily.    .. Depression screen [facility] 2/9 12/08/2016  Decreased Interest 2  Down, Depressed, Hopeless 1  PHQ - 2 Score 3  Altered sleeping 1  Tired, decreased energy 1  Change in appetite 1  Feeling bad or failure about yourself  1  Trouble concentrating 0  Moving slowly or fidgety/restless 0  Suicidal thoughts 0  PHQ-9 Score 7   .. GAD 7 : Generalized Anxiety Score 12/08/2016  Nervous, Anxious, on Edge 2  Control/stop worrying 2  Worry too much - different things 2  Trouble relaxing 2  Restless 1  Easily annoyed or irritable 2  Afraid - awful might happen 0  Total GAD 7 Score 11  Anxiety Difficulty Very difficult    Labs checked per patient in January 2018 CMP/CBC/TSH and normal.   Refilled medications. Discussed to use as needed klonapin due to side effects and abuse potential.  Gave information on viibryd to potentially have less side effects and still control mood.  Discussed CBT but patient declined.

## 2016-12-18 ENCOUNTER — Telehealth: Payer: Self-pay

## 2016-12-18 NOTE — Telephone Encounter (Signed)
Pre Authorization was sent to [employer]. Key: [id number] - Rx #: [id number]

## 2017-01-21 ENCOUNTER — Telehealth: Payer: Self-pay

## 2017-01-21 NOTE — Telephone Encounter (Signed)
Patient advised.

## 2017-01-21 NOTE — Telephone Encounter (Signed)
Let pt know. Can do flonase/rhinocort/nasonex OTC which ever works best.

## 2017-01-21 NOTE — Telephone Encounter (Signed)
[facility] denied Qnasl. Denial in basket.

## 2017-06-03 ENCOUNTER — Ambulatory Visit: Payer: BLUE CROSS/BLUE SHIELD | Admitting: Physician Assistant

## 2017-06-08 ENCOUNTER — Ambulatory Visit: Payer: BLUE CROSS/BLUE SHIELD | Admitting: Physician Assistant

## 2017-06-17 ENCOUNTER — Ambulatory Visit (INDEPENDENT_AMBULATORY_CARE_PROVIDER_SITE_OTHER): Payer: BLUE CROSS/BLUE SHIELD | Admitting: Physician Assistant

## 2017-06-17 ENCOUNTER — Encounter: Payer: Self-pay | Admitting: Physician Assistant

## 2017-06-17 VITALS — BP 128/65 | HR 75 | Ht 73.0 in | Wt 202.0 lb

## 2017-06-17 DIAGNOSIS — R5383 Other fatigue: Secondary | ICD-10-CM

## 2017-06-17 DIAGNOSIS — R0683 Snoring: Secondary | ICD-10-CM

## 2017-06-17 DIAGNOSIS — Z0000 Encounter for general adult medical examination without abnormal findings: Secondary | ICD-10-CM

## 2017-06-17 DIAGNOSIS — F329 Major depressive disorder, single episode, unspecified: Secondary | ICD-10-CM

## 2017-06-17 DIAGNOSIS — Z131 Encounter for screening for diabetes mellitus: Secondary | ICD-10-CM

## 2017-06-17 DIAGNOSIS — G478 Other sleep disorders: Secondary | ICD-10-CM | POA: Insufficient documentation

## 2017-06-17 DIAGNOSIS — Z13220 Encounter for screening for lipoid disorders: Secondary | ICD-10-CM | POA: Diagnosis not present

## 2017-06-17 DIAGNOSIS — F32A Depression, unspecified: Secondary | ICD-10-CM

## 2017-06-17 DIAGNOSIS — F419 Anxiety disorder, unspecified: Secondary | ICD-10-CM

## 2017-06-17 LAB — COMPLETE METABOLIC PANEL WITH GFR
AG Ratio: 2.1 (calc) (ref 1.0–2.5)
ALT: 47 U/L — AB (ref 9–46)
AST: 27 U/L (ref 10–40)
Albumin: 4.9 g/dL (ref 3.6–5.1)
Alkaline phosphatase (APISO): 54 U/L (ref 40–115)
BUN: 14 mg/dL (ref 7–25)
CO2: 28 mmol/L (ref 20–32)
CREATININE: 1.03 mg/dL (ref 0.60–1.35)
Calcium: 9.8 mg/dL (ref 8.6–10.3)
Chloride: 103 mmol/L (ref 98–110)
GFR, EST NON AFRICAN AMERICAN: 98 mL/min/{1.73_m2} (ref >=60)
GFR, Est African American: 113 mL/min/{1.73_m2} (ref >=60)
GLUCOSE: 94 mg/dL (ref 65–99)
Globulin: 2.3 g/dL (calc) (ref 1.9–3.7)
Potassium: 4.5 mmol/L (ref 3.5–5.3)
SODIUM: 140 mmol/L (ref 135–146)
Total Bilirubin: 0.7 mg/dL (ref 0.2–1.2)
Total Protein: 7.2 g/dL (ref 6.1–8.1)

## 2017-06-17 LAB — LIPID PANEL W/REFLEX DIRECT LDL
CHOLESTEROL: 198 mg/dL (ref <200)
HDL: 47 mg/dL (ref >40)
LDL Cholesterol (Calc): 120 mg/dL (calc) — ABNORMAL HIGH
Non-HDL Cholesterol (Calc): 151 mg/dL (calc) — ABNORMAL HIGH (ref <130)
TRIGLYCERIDES: 192 mg/dL — AB (ref <150)
Total CHOL/HDL Ratio: 4.2 (calc) (ref <5.0)

## 2017-06-17 MED ORDER — CLONAZEPAM 0.5 MG PO TABS: 0.5000 mg | ORAL_TABLET | Freq: Two times a day (BID) | ORAL | 5 refills | Status: DC | PRN

## 2017-06-17 MED ORDER — ESCITALOPRAM OXALATE 10 MG PO TABS: 10.0000 mg | ORAL_TABLET | Freq: Every day | ORAL | 4 refills | Status: DC

## 2017-06-17 NOTE — Patient Instructions (Signed)

## 2017-06-17 NOTE — Progress Notes (Signed)
Call pt: LDL up a bit from last year but still in acceptable range for age and risk factors. TG and liver enzymes just a tad elevated. Could be due to the 2 weeks of vacation diet. Watch sugars and carbs. Consider fish oil as a nice supplement for TGs.

## 2017-06-17 NOTE — Progress Notes (Signed)
Subjective:    Patient ID: Casey Baird, male    DOB: 22-Oct-1990, 27 y.o.   MRN: [id number]  HPI Casey Baird is a 27 year old male who presents for his annual. He has no specific complaints at this time. He does however mention that his wife has some concern about sleep apnea. He states that she witnesses him snore loudly at night, has observed him stop breathing, and he feels more fatigued throughout the day. He states that he is doing well on the Lexapro and Klonopin and has had no increase in anxiety or depression. He states that it varies how much Klonopin he takes, sometimes twice a day, sometimes once, and sometime none. He denies having to take any extra Klonopin.  .. Active Ambulatory Problems    Diagnosis Date Noted  . Chronic sinusitis 04/12/2012  . Anxiety and depression 04/12/2012  . 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 Calculus, tonsil 07/26/2012  . Hypertriglyceridemia 11/01/2012  . S/P tonsillectomy 04/18/2013  . Seasonal allergic rhinitis due to pollen 12/08/2016  . Fatigue 06/17/2017  . Non-restorative sleep 06/17/2017  . Snoring 06/17/2017   Resolved Ambulatory Problems    Diagnosis Date Noted  . No Resolved Ambulatory Problems   Past Medical History:  Diagnosis Date  . Anxiety and depression 04/12/2012  . Chronic sinusitis 04/12/2012      Review of Systems  Constitutional: Positive for fatigue.  HENT: Negative for congestion, ear pain and sinus pressure.   Eyes: Negative for visual disturbance.  Respiratory: Positive for apnea. Negative for cough and shortness of breath.   Cardiovascular: Negative for chest pain.  Gastrointestinal: Negative for diarrhea, nausea and vomiting.  Endocrine: Negative for cold intolerance and heat intolerance.  Neurological: Negative for headaches.       Objective:   Physical Exam  Constitutional: He is oriented to person, place, and time. He appears well-developed and well-nourished.  HENT:  Head: Normocephalic and atraumatic.   Right Ear: Tympanic membrane and external ear normal.  Left Ear: Tympanic membrane and external ear normal.  Mouth/Throat: Mucous membranes are normal. No dental caries. No oropharyngeal exudate, posterior oropharyngeal edema or posterior oropharyngeal erythema.  Eyes: Pupils are equal, round, and reactive to light. Conjunctivae and EOM are normal. Right eye exhibits no discharge. No scleral icterus.  Cardiovascular: Normal rate, regular rhythm, normal heart sounds and intact distal pulses.  No murmur heard. Pulmonary/Chest: Effort normal and breath sounds normal. No respiratory distress. He has no wheezes.  Abdominal: Soft. He exhibits no distension. There is no tenderness.  Neurological: He is alert and oriented to person, place, and time.  Skin: Skin is warm and dry.  Psychiatric: He has a normal mood and affect. His behavior is normal.  Nursing note and vitals reviewed.         Assessment & Plan:  Marland KitchenMarland KitchenThedore Baird was seen today for annual exam.  Diagnoses and all orders for this visit:  Routine physical examination -     Lipid Panel w/reflex Direct LDL -     COMPLETE METABOLIC PANEL WITH GFR  Snoring  Fatigue, unspecified type  Non-restorative sleep  Screening for lipid disorders -     Lipid Panel w/reflex Direct LDL  Screening for diabetes mellitus -     COMPLETE METABOLIC PANEL WITH GFR  Anxiety and depression -     escitalopram (LEXAPRO) 10 MG tablet; Take 1 tablet (10 mg total) by mouth daily. -     clonazePAM (KLONOPIN) 0.5 MG tablet; Take 1 tablet (0.5 mg total)  by mouth 2 (two) times daily as needed.   .. Depression screen [facility] 2/9 06/17/2017 12/08/2016  Decreased Interest 0 2  Down, Depressed, Hopeless 0 1  PHQ - 2 Score 0 3  Altered sleeping 1 1  Tired, decreased energy 1 1  Change in appetite 0 1  Feeling bad or failure about yourself  0 1  Trouble concentrating 0 0  Moving slowly or fidgety/restless 0 0  Suicidal thoughts 0 0  PHQ-9 Score 2 7  Difficult  doing work/chores Not difficult at all -   .. GAD 7 : Generalized Anxiety Score 06/17/2017 12/08/2016  Nervous, Anxious, on Edge 1 2  Control/stop worrying 1 2  Worry too much - different things 1 2  Trouble relaxing 1 2  Restless 0 1  Easily annoyed or irritable 0 2  Afraid - awful might happen 0 0  Total GAD 7 Score 4 11  Anxiety Difficulty Somewhat difficult Very difficult    STOP BANG was 5 yes for high risk. Will order sleep study.  Fasting labs ordered.  Marland Kitchen.Start a regular exercise program and make sure you are eating a healthy diet Try to eat 4 servings of dairy a day or take a calcium supplement (500mg  twice a day). Your vaccines are up to date.   Refilled lexapro and klonapin.

## 2017-07-14 ENCOUNTER — Encounter (HOSPITAL_BASED_OUTPATIENT_CLINIC_OR_DEPARTMENT_OTHER): Payer: BLUE CROSS/BLUE SHIELD

## 2017-08-11 ENCOUNTER — Other Ambulatory Visit: Payer: Self-pay | Admitting: Physician Assistant

## 2017-08-11 DIAGNOSIS — D229 Melanocytic nevi, unspecified: Secondary | ICD-10-CM

## 2017-08-11 NOTE — Progress Notes (Signed)
Pt came in with is wife and mentions a mole on his left shoulder that is changing and concerning. He has had moles removed before and benign. This mole is greater than 59mm, asymmetric, irregular borders and dark pigmentation. Agreed with dermatology referral. He does not want biopsied unless has too so wants a specialist to look at it.

## 2017-12-02 ENCOUNTER — Ambulatory Visit (INDEPENDENT_AMBULATORY_CARE_PROVIDER_SITE_OTHER): Payer: BLUE CROSS/BLUE SHIELD | Admitting: Physician Assistant

## 2017-12-02 ENCOUNTER — Encounter: Payer: Self-pay | Admitting: Physician Assistant

## 2017-12-02 VITALS — BP 119/75 | HR 55 | Temp 97.6°F | Resp 14 | Wt 189.0 lb

## 2017-12-02 DIAGNOSIS — J019 Acute sinusitis, unspecified: Secondary | ICD-10-CM | POA: Diagnosis not present

## 2017-12-02 MED ORDER — CEFUROXIME AXETIL 250 MG PO TABS: 250.0000 mg | ORAL_TABLET | Freq: Two times a day (BID) | ORAL | 0 refills | Status: AC

## 2017-12-02 NOTE — Patient Instructions (Signed)

## 2017-12-02 NOTE — Progress Notes (Signed)
HPI:                                                                Casey Baird is a 27 y.o. male who presents to [facility] today for URI symptoms  URI   This is a new problem. The current episode started 1 to 4 weeks ago (x 2 weeks). The problem has been waxing and waning. There has been no fever. Associated symptoms include congestion, coughing (clear sputum), sinus pain (pressure) and a sore throat. Pertinent negatives include no ear pain. He has tried decongestant (Flonase, Mucinex) for the symptoms.  Sick contacts: wife diagnosed with pneumonia   Past Medical History:  Diagnosis Date  . Anxiety and depression 04/12/2012  . Chronic sinusitis 04/12/2012   History reviewed. No pertinent surgical history. Social History   Tobacco Use  . Smoking status: Never Smoker  . Smokeless tobacco: Never Used  Substance Use Topics  . Alcohol use: Yes    Alcohol/week: 2.0 standard drinks    Types: 2 Standard drinks or equivalent per week   family history includes Alcohol abuse in his maternal grandfather; Depression in his unknown relative; Diabetes in his maternal uncle.    ROS: negative except as noted in the HPI  Medications: Current Outpatient Medications  Medication Sig Dispense Refill  . Beclomethasone Dipropionate 80 MCG/ACT AERS 1 spray each nostril daily. 1 Inhaler 12  . cefUROXime (CEFTIN) 250 MG tablet Take 1 tablet (250 mg total) by mouth 2 (two) times daily with a meal for 10 days. 20 tablet 0  . clonazePAM (KLONOPIN) 0.5 MG tablet Take 1 tablet (0.5 mg total) by mouth 2 (two) times daily as needed. 60 tablet 5  . escitalopram (LEXAPRO) 10 MG tablet Take 1 tablet (10 mg total) by mouth daily. 90 tablet 4  . fluticasone (FLONASE) 50 MCG/ACT nasal spray 2 sprays by Each Nare route daily.     No current facility-administered medications for this visit.    Allergies  Allergen Reactions  . Abilify [Aripiprazole] Other (See  Comments)    Suicidal Ideation  . Amoxicillin Nausea And Vomiting       Objective:  BP 119/75   Pulse (!) 55   Temp 97.6 F (36.4 C) (Oral)   Resp 14   Wt 189 lb (85.7 kg)   SpO2 99%   BMI 24.94 kg/m  Gen:  alert, not ill-appearing, no distress, appropriate for age 72: head normocephalic without obvious abnormality, conjunctiva and cornea clear, wearing glasses, TM's pearly gray and semi-transparent, oropharynx clear, nasal mucosa edematous, no sinus tenderness, neck supple, no cervical adenopathy, trachea midline Pulm: Normal work of breathing, normal phonation, clear to auscultation bilaterally, no wheezes, rales or rhonchi CV: Normal rate, regular rhythm, s1 and s2 distinct, no murmurs, clicks or rubs  Neuro: alert and oriented x 3, no tremor MSK: extremities atraumatic, normal gait and station Skin: intact, no rashes on exposed skin, no jaundice, no cyanosis   No results found for this or any previous visit (from the past 72 hour(s)). No results found.    Assessment and Plan: 27 y.o. male with   .Diagnoses and all orders for this visit:  Acute non-recurrent sinusitis, unspecified location -     cefUROXime (  CEFTIN) 250 MG tablet; Take 1 tablet (250 mg total) by mouth 2 (two) times daily with a meal for 10 days.   - afebrile, no tachycardia, no tachypnea, no adventitious lung sounds - given waxing and waning symptoms for 2 weeks, second sickening yesterday and history of recurrent sinusitis, will treat empirically for bacterial sinusitis - work note provided   Patient education and anticipatory guidance given Patient agrees with treatment plan Follow-up as needed if symptoms worsen or fail to improve  Darlyne Russian PA-C

## 2017-12-19 ENCOUNTER — Encounter: Payer: Self-pay | Admitting: Physician Assistant

## 2017-12-21 ENCOUNTER — Telehealth: Payer: Self-pay

## 2017-12-21 NOTE — Telephone Encounter (Signed)
Patient needed FMLA paperwork faxed over. Paperwork faxed over and completed today 12/21/2017. I also made an extra copy to put in the scan folder along with leaving the original copy at the front desk for patient to come pick up later this week.

## 2018-01-27 ENCOUNTER — Other Ambulatory Visit: Payer: Self-pay

## 2018-01-27 ENCOUNTER — Encounter: Payer: Self-pay | Admitting: Physician Assistant

## 2018-01-27 ENCOUNTER — Ambulatory Visit (INDEPENDENT_AMBULATORY_CARE_PROVIDER_SITE_OTHER): Payer: BLUE CROSS/BLUE SHIELD | Admitting: Physician Assistant

## 2018-01-27 ENCOUNTER — Ambulatory Visit (INDEPENDENT_AMBULATORY_CARE_PROVIDER_SITE_OTHER): Payer: BLUE CROSS/BLUE SHIELD

## 2018-01-27 VITALS — BP 133/84 | HR 70 | Ht 73.0 in | Wt 185.0 lb

## 2018-01-27 DIAGNOSIS — R0602 Shortness of breath: Secondary | ICD-10-CM

## 2018-01-27 DIAGNOSIS — R05 Cough: Secondary | ICD-10-CM | POA: Diagnosis not present

## 2018-01-27 DIAGNOSIS — F329 Major depressive disorder, single episode, unspecified: Secondary | ICD-10-CM

## 2018-01-27 DIAGNOSIS — J189 Pneumonia, unspecified organism: Secondary | ICD-10-CM

## 2018-01-27 DIAGNOSIS — F419 Anxiety disorder, unspecified: Secondary | ICD-10-CM | POA: Diagnosis not present

## 2018-01-27 DIAGNOSIS — R059 Cough, unspecified: Secondary | ICD-10-CM

## 2018-01-27 MED ORDER — ALBUTEROL SULFATE HFA 108 (90 BASE) MCG/ACT IN AERS: 2.0000 | INHALATION_SPRAY | Freq: Four times a day (QID) | RESPIRATORY_TRACT | 2 refills | Status: DC | PRN

## 2018-01-27 MED ORDER — CLONAZEPAM 0.5 MG PO TABS: 0.5000 mg | ORAL_TABLET | Freq: Two times a day (BID) | ORAL | 5 refills | Status: DC | PRN

## 2018-01-27 MED ORDER — PREDNISONE 50 MG PO TABS: ORAL_TABLET | ORAL | 0 refills | Status: DC

## 2018-01-27 NOTE — Progress Notes (Signed)
Subjective:    Patient ID: Casey Baird, male    DOB: 06-05-1990, 27 y.o.   MRN: [id number]  HPI  Pt is a 27 yo male with hx of chronic sinusitis and seasonal allergies who presents to the clinic with persistent cough after dx with pneumonia. He was originally dx with sinus infection and given ceftin. He got a little better then worse. He went to UC and empirically treated for pneumonia with Cleocin, prednisone and albuterol inhaler. He does feel a lot better but been over a month and still struggles with SOB and cough. He is using albuterol inhaler regularly. He is using flonase and mucinex as well. He does admit that he has fallen off allergy shots due to time committment.    Overall patients mood has not been great. He has been on lexapro and klonapin for a while and pretty controlled. This year father died and going through a lot of work stress. He feels like he might need to talk with someone. He admits he has been drinking more alcohol and does not want to depend on that. No SI/HC.   Marland Kitchen. Active Ambulatory Problems    Diagnosis Date Noted  . Chronic sinusitis 04/12/2012  . Anxiety and depression 04/12/2012  . 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 Hypertriglyceridemia 11/01/2012  . S/P tonsillectomy 04/18/2013  . Seasonal allergic rhinitis due to pollen 12/08/2016  . Fatigue 06/17/2017  . Non-restorative sleep 06/17/2017  . Snoring 06/17/2017   Resolved Ambulatory Problems    Diagnosis Date Noted  . Calculus, tonsil 07/26/2012   No Additional Past Medical History      Review of Systems See HPI.     Objective:   Physical Exam  Constitutional: He is oriented to person, place, and time. He appears well-developed and well-nourished.  HENT:  Head: Normocephalic and atraumatic.  Right Ear: External ear normal.  Left Ear: External ear normal.  Nose: Nose normal.  Mouth/Throat: Oropharynx is clear and moist. No oropharyngeal exudate.  Eyes: Conjunctivae are normal. Right eye  exhibits no discharge. Left eye exhibits no discharge.  Neck: Normal range of motion.  Cardiovascular: Normal rate and regular rhythm.  Pulmonary/Chest: Effort normal and breath sounds normal. He has no wheezes.  Coughing a lot with peak flows.   Lymphadenopathy:    He has no cervical adenopathy.  Neurological: He is alert and oriented to person, place, and time.  Psychiatric: He has a normal mood and affect. His behavior is normal.          Assessment & Plan:  Marland KitchenMarland KitchenDiagnoses and all orders for this visit:  Cough -     albuterol (PROVENTIL HFA;VENTOLIN HFA) 108 (90 Base) MCG/ACT inhaler; Inhale 2 puffs into the lungs every 6 (six) hours as needed for wheezing or shortness of breath. -     DG Chest 2 View -     predniSONE (DELTASONE) 50 MG tablet; One tab PO daily for 5 days.  SOB (shortness of breath) -     albuterol (PROVENTIL HFA;VENTOLIN HFA) 108 (90 Base) MCG/ACT inhaler; Inhale 2 puffs into the lungs every 6 (six) hours as needed for wheezing or shortness of breath. -     DG Chest 2 View -     predniSONE (DELTASONE) 50 MG tablet; One tab PO daily for 5 days.  Community acquired pneumonia, unspecified laterality -     albuterol (PROVENTIL HFA;VENTOLIN HFA) 108 (90 Base) MCG/ACT inhaler; Inhale 2 puffs into the lungs every 6 (six) hours as needed  for wheezing or shortness of breath. -     DG Chest 2 View  Anxiety  Anxiety and depression -     Ambulatory referral to Psychology  peak flow originally in yellow. Duo neb given. Peak flows improved to green.   STAT CXR- no acute changes. Pneumonia has cleared. Prednisone burst given.   Due to over 6 weeks of coughing I do think there could be an asthma component/reactive airway disease vs post viral cough. Symbicort 2 puffs bid sample given. Follow up in 1 month. Continue albuterol as needed. Strongly consider getting back on allergy shots.   Keep anxiety and depression meds the same for now. Referred to counseling.   Marland Kitchen.Spent  30 minutes with patient and greater than 50 percent of visit spent counseling patient regarding treatment plan.

## 2018-01-27 NOTE — Patient Instructions (Addendum)
symbicort 2 puffs twice a day. Follow up in 3 weeks   Casey Baird  Address: [address] Casey Baird [place], [place] [address]  Phone: [phone]

## 2018-01-27 NOTE — Progress Notes (Signed)
Call pt: pneumonia has completely cleared. Start prednisone for 5 days then sample inhaler for symbicort given 2 puffs twice a day. Follow up in 3 weeks.

## 2018-01-29 ENCOUNTER — Telehealth: Payer: Self-pay | Admitting: Physician Assistant

## 2018-01-29 NOTE — Telephone Encounter (Signed)
I still don't see peak flow measurements?

## 2018-03-03 NOTE — Telephone Encounter (Signed)
Done. Peak flow measurements have been added.

## 2018-04-20 ENCOUNTER — Encounter: Payer: Self-pay | Admitting: Physician Assistant

## 2018-05-18 ENCOUNTER — Ambulatory Visit (INDEPENDENT_AMBULATORY_CARE_PROVIDER_SITE_OTHER): Payer: BLUE CROSS/BLUE SHIELD | Admitting: Physician Assistant

## 2018-05-18 ENCOUNTER — Encounter: Payer: Self-pay | Admitting: Physician Assistant

## 2018-05-18 VITALS — BP 116/70 | HR 64 | Temp 97.9°F | Resp 16 | Ht 73.0 in | Wt 196.0 lb

## 2018-05-18 DIAGNOSIS — L731 Pseudofolliculitis barbae: Secondary | ICD-10-CM

## 2018-05-18 DIAGNOSIS — G478 Other sleep disorders: Secondary | ICD-10-CM

## 2018-05-18 DIAGNOSIS — R0683 Snoring: Secondary | ICD-10-CM | POA: Diagnosis not present

## 2018-05-18 DIAGNOSIS — R5383 Other fatigue: Secondary | ICD-10-CM

## 2018-05-18 DIAGNOSIS — J3081 Allergic rhinitis due to animal (cat) (dog) hair and dander: Secondary | ICD-10-CM

## 2018-05-18 DIAGNOSIS — J301 Allergic rhinitis due to pollen: Secondary | ICD-10-CM | POA: Diagnosis not present

## 2018-05-18 MED ORDER — CLINDAMYCIN PHOS-BENZOYL PEROX 1-5 % EX GEL: Freq: Two times a day (BID) | CUTANEOUS | 1 refills | Status: DC

## 2018-05-18 NOTE — Progress Notes (Signed)
   Subjective:    Patient ID: Casey Baird, male    DOB: 07-23-1990, 28 y.o.   MRN: [id number]  HPI  Patient is a 28 year old male who presents to the clinic with a painful bump in between his testicles and anus that he has noticed for the last 5 days.  It is tender to touch but not painful.  He has not noticed any swelling, redness, drainage.  He has not tried anything to make better.  He does admit to shaving in this area.  He has met his deductible this year and would like to have another referral for his sleep study.  He continues to snore and have nonrestorative sleep.  It was ordered last year but cost was an issue.  Patient's mood is doing really well.  He is going to therapy regularly and seems to be helping.  Denies any suicidal thoughts or homicidal idealizations.  He also would like an allergy referral in [place].  He has a known history of allergies to cats and dogs.  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  He previously had allergy shots but cannot travel to [place] regularly to get these.  He would like to reconsider allergy shots. He is taking as needed anti-histamine but not regularly.   .. Active Ambulatory Problems    Diagnosis Date Noted  . Chronic sinusitis 04/12/2012  . Anxiety and depression 04/12/2012  . 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 Hypertriglyceridemia 11/01/2012  . S/P tonsillectomy 04/18/2013  . Seasonal allergic rhinitis due to pollen 12/08/2016  . Fatigue 06/17/2017  . Non-restorative sleep 06/17/2017  . Snoring 06/17/2017  . 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 Calculus, tonsil 07/26/2012   No Additional Past Medical History        Review of Systems    see HPI.  Objective:   Physical Exam Vitals signs reviewed.  Cardiovascular:     Rate and Rhythm: Normal rate.     Pulses: Normal pulses.  Pulmonary:     Effort: Pulmonary effort is normal.    Genitourinary:   Neurological:     General: No focal deficit present.     Mental Status: He is alert.           Assessment & Plan:  Marland KitchenMarland KitchenDomonik was seen today for lump under left testicle x5 days.  Diagnoses and all orders for this visit:  Ingrown hair -     clindamycin-benzoyl peroxide (BENZACLIN) gel; Apply topically 2 (two) times daily.  Non-restorative sleep -     Split night study  Seasonal allergic rhinitis due to pollen -     Ambulatory referral to Allergy  Snoring -     Split night study  Fatigue, unspecified type -     Split night study  Animal dander allergy -     Ambulatory referral to Allergy   Discussed warm compresses and benzaclin bid for 2 weeks. Keep area well groomed. Follow up as needed.   Referral made to allergy. Encouraged to start zyrtec daily and consider adding flonase as needed.   Referral made for sleep testing.   Very happy counseling is going so well.

## 2018-06-10 ENCOUNTER — Encounter: Payer: Self-pay | Admitting: Physician Assistant

## 2018-06-12 ENCOUNTER — Encounter: Payer: Self-pay | Admitting: Physician Assistant

## 2018-06-29 ENCOUNTER — Other Ambulatory Visit: Payer: Self-pay | Admitting: Physician Assistant

## 2018-06-29 DIAGNOSIS — F419 Anxiety disorder, unspecified: Principal | ICD-10-CM

## 2018-06-29 DIAGNOSIS — F329 Major depressive disorder, single episode, unspecified: Secondary | ICD-10-CM

## 2018-07-25 ENCOUNTER — Other Ambulatory Visit: Payer: Self-pay | Admitting: Physician Assistant

## 2018-07-25 DIAGNOSIS — F419 Anxiety disorder, unspecified: Principal | ICD-10-CM

## 2018-07-25 DIAGNOSIS — F329 Major depressive disorder, single episode, unspecified: Secondary | ICD-10-CM

## 2018-07-26 NOTE — Telephone Encounter (Signed)
Appt scheduled for Monday 08/02/18.. 1 week supply sent

## 2018-07-26 NOTE — Telephone Encounter (Signed)
Needs updated phq and GAD need virtual visit. Can we get on tomorrow?

## 2018-07-27 ENCOUNTER — Other Ambulatory Visit: Payer: Self-pay | Admitting: Physician Assistant

## 2018-07-27 DIAGNOSIS — F329 Major depressive disorder, single episode, unspecified: Secondary | ICD-10-CM

## 2018-07-27 DIAGNOSIS — F419 Anxiety disorder, unspecified: Principal | ICD-10-CM

## 2018-07-28 NOTE — Telephone Encounter (Signed)
Please contact patient to schedule virtual or office visit for follow up prior to refills. Thanks!

## 2018-08-02 ENCOUNTER — Ambulatory Visit (INDEPENDENT_AMBULATORY_CARE_PROVIDER_SITE_OTHER): Payer: BLUE CROSS/BLUE SHIELD | Admitting: Physician Assistant

## 2018-08-02 ENCOUNTER — Encounter: Payer: Self-pay | Admitting: Physician Assistant

## 2018-08-02 VITALS — Temp 98.0°F | Ht 73.0 in | Wt 197.0 lb

## 2018-08-02 DIAGNOSIS — F329 Major depressive disorder, single episode, unspecified: Secondary | ICD-10-CM | POA: Diagnosis not present

## 2018-08-02 DIAGNOSIS — F419 Anxiety disorder, unspecified: Secondary | ICD-10-CM | POA: Diagnosis not present

## 2018-08-02 DIAGNOSIS — R0602 Shortness of breath: Secondary | ICD-10-CM | POA: Diagnosis not present

## 2018-08-02 DIAGNOSIS — J301 Allergic rhinitis due to pollen: Secondary | ICD-10-CM | POA: Diagnosis not present

## 2018-08-02 DIAGNOSIS — R0789 Other chest pain: Secondary | ICD-10-CM

## 2018-08-02 DIAGNOSIS — J3081 Allergic rhinitis due to animal (cat) (dog) hair and dander: Secondary | ICD-10-CM

## 2018-08-02 MED ORDER — CLONAZEPAM 0.5 MG PO TABS: 0.5000 mg | ORAL_TABLET | Freq: Two times a day (BID) | ORAL | 1 refills | Status: DC | PRN

## 2018-08-02 MED ORDER — ESCITALOPRAM OXALATE 10 MG PO TABS: 10.0000 mg | ORAL_TABLET | Freq: Every day | ORAL | 3 refills | Status: DC

## 2018-08-02 MED ORDER — FLUTICASONE PROPIONATE 50 MCG/ACT NA SUSP: 2.0000 | Freq: Every day | NASAL | 3 refills | Status: DC | PRN

## 2018-08-02 MED ORDER — BUDESONIDE-FORMOTEROL FUMARATE 160-4.5 MCG/ACT IN AERO: 2.0000 | INHALATION_SPRAY | Freq: Two times a day (BID) | RESPIRATORY_TRACT | 2 refills | Status: DC

## 2018-08-02 NOTE — Progress Notes (Signed)
Patient ID: Casey Baird, male   DOB: March 01, 1991, 28 y.o.   MRN: [id number] .Marland KitchenVirtual Visit via Video Note  I connected with Casey Baird on 08/03/18 at  9:10 AM EDT by a video enabled telemedicine application and verified that I am speaking with the correct person using two identifiers.  Location: Patient: work Provider: clinic   I discussed the limitations of evaluation and management by telemedicine and the availability of in person appointments. The patient expressed understanding and agreed to proceed.  History of Present Illness: Pt is a 28 yo male with seasonal allergies, environmental allergies, animal allergies, anxiety and depression who calls into clinic for his 6 month refill.   He was recently seen by allergist and started on clarinex. Seems to be helping pretty well. Does need a refill of flonase. Insurance would not pay for qnasal. He was put on qvar daily for coughing and SOB that has not cleared since pneumonia last fall. He used qvar for one month with no improvement. He was approved for immunotherapy was logistically could not make it to appointments. He has rescue inhaler which does help at times.   He continues to have anxiety and depression. COVID 19 has increased this. No SI/HC. lexapro and klonapin are still working well. He is using his klonapin a bit more. He does need refills.   .. Active Ambulatory Problems    Diagnosis Date Noted  . Chronic sinusitis 04/12/2012  . Anxiety and depression 04/12/2012  . 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 Hypertriglyceridemia 11/01/2012  . S/P tonsillectomy 04/18/2013  . Seasonal allergic rhinitis due to pollen 12/08/2016  . Fatigue 06/17/2017  . Non-restorative sleep 06/17/2017  . Snoring 06/17/2017  . 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 Chest tightness 08/03/2018  . SOB (shortness of breath) 08/03/2018   Resolved Ambulatory Problems    Diagnosis Date Noted  . Calculus, tonsil 07/26/2012   No Additional Past Medical  History   Reviewed    Observations/Objective: No acute distress.  Normal mood.  Normal breathing.   .. Today's Vitals   08/02/18 0824  Temp: 98 F (36.7 C)  TempSrc: Oral  Weight: 197 lb (89.4 kg)  Height: 6\' 1"  (1.854 m)   Body mass index is 25.99 kg/m.   .. Depression screen [facility] 2/9 08/02/2018 06/17/2017 12/08/2016  Decreased Interest 2 0 2  Down, Depressed, Hopeless 2 0 1  PHQ - 2 Score 4 0 3  Altered sleeping 2 1 1   Tired, decreased energy 1 1 1   Change in appetite 1 0 1  Feeling bad or failure about yourself  2 0 1  Trouble concentrating 2 0 0  Moving slowly or fidgety/restless 0 0 0  Suicidal thoughts 1 0 0  PHQ-9 Score 13 2 7   Difficult doing work/chores Somewhat difficult Not difficult at all -   .. GAD 7 : Generalized Anxiety Score 08/02/2018 06/17/2017 12/08/2016  Nervous, Anxious, on Edge 3 1 2   Control/stop worrying 2 1 2   Worry too much - different things 2 1 2   Trouble relaxing 3 1 2   Restless 0 0 1  Easily annoyed or irritable 2 0 2  Afraid - awful might happen 2 0 0  Total GAD 7 Score 14 4 11   Anxiety Difficulty Somewhat difficult Somewhat difficult Very difficult     Assessment and Plan: Marland KitchenMarland KitchenChirstopher was seen today for depression.  Diagnoses and all orders for this visit:  Anxiety and depression -     clonazePAM (KLONOPIN) 0.5 MG  tablet; Take 1 tablet (0.5 mg total) by mouth 2 (two) times daily as needed. -     escitalopram (LEXAPRO) 10 MG tablet; Take 1 tablet (10 mg total) by mouth daily.  Seasonal allergic rhinitis due to pollen -     fluticasone (FLONASE) 50 MCG/ACT nasal spray; Place 2 sprays into both nostrils daily as needed for allergies or rhinitis.  SOB (shortness of breath) -     budesonide-formoterol (SYMBICORT) 160-4.5 MCG/ACT inhaler; Inhale 2 puffs into the lungs 2 (two) times a day.  Chest tightness -     budesonide-formoterol (SYMBICORT) 160-4.5 MCG/ACT inhaler; Inhale 2 puffs into the lungs 2 (two) times a day.  Animal  dander allergy   Pt admits anxiety and depression are a little worse due to COVID 19. Does not wish to make any medication adjustments at this time. Refills given. Continue with counseling. Make sure getting exercise. Follow up as needed.   Refilled flonase. Since ICS, OVAR did not seem to improve symptoms will try symbicort ICS with LABA. Follow up in 4 weeks. Per patient had breathing test done at allergist but I do not have results. Per pt they were inconclusive.   Follow Up Instructions:    I discussed the assessment and treatment plan with the patient. The patient was provided an opportunity to ask questions and all were answered. The patient agreed with the plan and demonstrated an understanding of the instructions.   The patient was advised to call back or seek an in-person evaluation if the symptoms worsen or if the condition fails to improve as anticipated.  I provided 25 minutes of non-face-to-face time during this encounter.   Iran Planas, PA-C

## 2018-08-02 NOTE — Progress Notes (Signed)
Patient needing refills on medications. PHQ9-GAD7 completed.

## 2018-08-03 DIAGNOSIS — R0789 Other chest pain: Secondary | ICD-10-CM | POA: Insufficient documentation

## 2018-08-03 DIAGNOSIS — R0602 Shortness of breath: Secondary | ICD-10-CM | POA: Insufficient documentation

## 2018-09-30 NOTE — Telephone Encounter (Signed)
Please close encounter

## 2018-10-30 ENCOUNTER — Other Ambulatory Visit: Payer: Self-pay | Admitting: Physician Assistant

## 2018-10-30 DIAGNOSIS — R0789 Other chest pain: Secondary | ICD-10-CM

## 2018-10-30 DIAGNOSIS — R0602 Shortness of breath: Secondary | ICD-10-CM

## 2018-11-08 ENCOUNTER — Encounter: Payer: Self-pay | Admitting: Physician Assistant

## 2018-11-08 NOTE — Telephone Encounter (Signed)
Error

## 2018-11-09 ENCOUNTER — Ambulatory Visit (INDEPENDENT_AMBULATORY_CARE_PROVIDER_SITE_OTHER): Payer: BC Managed Care – PPO | Admitting: Physician Assistant

## 2018-11-09 VITALS — Temp 97.3°F | Ht 73.0 in | Wt 195.0 lb

## 2018-11-09 DIAGNOSIS — R0609 Other forms of dyspnea: Secondary | ICD-10-CM | POA: Diagnosis not present

## 2018-11-09 DIAGNOSIS — R0602 Shortness of breath: Secondary | ICD-10-CM

## 2018-11-09 DIAGNOSIS — R05 Cough: Secondary | ICD-10-CM

## 2018-11-09 DIAGNOSIS — R053 Chronic cough: Secondary | ICD-10-CM

## 2018-11-09 NOTE — Progress Notes (Signed)
Patient ID: Casey Baird, male   DOB: 1990/08/07, 28 y.o.   MRN: [id number] .Marland KitchenVirtual Visit via Video Note  I connected with Casey Baird on 11/10/18 at  1:20 PM EDT by a video enabled telemedicine application and verified that I am speaking with the correct person using two identifiers.  Location: Patient: home Provider: clinic   I discussed the limitations of evaluation and management by telemedicine and the availability of in person appointments. The patient expressed understanding and agreed to proceed.  History of Present Illness: Patient is a 28 year old male who comes into the clinic with persistent shortness of breath and dry cough since his pneumonia last September 2019.  He feels like he is never fully recovered from his pneumonia.  He feels about 80%.  He did have a chest x-ray in November 2019 that showed the pneumonia had cleared.  Continues to have shortness of breath with minimal activity.  He feels like when he takes a deep breath and and breathes out he hears wheezing.  His symptoms occur indoors and outdoors.  He does have extensive environmental and seasonal allergies.  He was getting shots from [facility] but was unable to keep up with the schedule.  He uses Clarinex and Flonase daily.  Albuterol inhaler seems to help when he gets really short of breath.  Symbicort did not seem to help very much.  He denies any problems lying flat or any swelling.  He has not had any chest pains or chest tightness.  He does not have any family history of lung diseases other than his grandfather died of lung cancer after smoking for years.  He denies any reflux symptoms or worsening of cough or shortness of breath after eating.  .. Active Ambulatory Problems    Diagnosis Date Noted  . Chronic sinusitis 04/12/2012  . Anxiety and depression 04/12/2012  . 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 Hypertriglyceridemia 11/01/2012  . S/P tonsillectomy 04/18/2013  . Seasonal allergic rhinitis due  to pollen 12/08/2016  . Fatigue 06/17/2017  . Non-restorative sleep 06/17/2017  . Snoring 06/17/2017  . 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 Chest tightness 08/03/2018  . Shortness of breath 08/03/2018  . Dyspnea on exertion 11/10/2018  . Persistent cough for 3 weeks or longer 11/10/2018   Resolved Ambulatory Problems    Diagnosis Date Noted  . Calculus, tonsil 07/26/2012   No Additional Past Medical History   Reviewed med, allergy, problem list.     Observations/Objective: No acute distress. No labored breathing No cough When he takes a deep breath in and breathes out wheezing is heard.   .. Today's Vitals   11/09/18 1314  Temp: (!) 97.3 F (36.3 C)  TempSrc: Oral  Weight: 195 lb (88.5 kg)  Height: 6\' 1"  (1.854 m)   Body mass index is 25.73 kg/m.   Assessment and Plan: Marland KitchenMarland KitchenKeontae was seen today for shortness of breath.  Diagnoses and all orders for this visit:  Dyspnea on exertion  Shortness of breath -     CT Chest Wo Contrast  Persistent cough for 3 weeks or longer   Unclear etiology of SOB. Pt denies any other symptoms of CHF. No edema, orthopnea etc. I do not think he needs an echo. He does have extensive environmental and seasonal allergies and not taking shots for last 2 months. Suspected more inflammation/asthma but symbicort did not help much. Albuterol does help some. He will lose insurance in 12 days. No time for referral. Will get CT of  chest and spirometry scheduled.   Follow Up Instructions:    I discussed the assessment and treatment plan with the patient. The patient was provided an opportunity to ask questions and all were answered. The patient agreed with the plan and demonstrated an understanding of the instructions.   The patient was advised to call back or seek an in-person evaluation if the symptoms worsen or if the condition fails to improve as anticipated.    Iran Planas, PA-C

## 2018-11-09 NOTE — Progress Notes (Signed)
Pneumonia last September (2019)  If does any cardio having SOB/cough. Not any better for the last year. Feels SOB with minimal activity. Happens inside and outside.  Symbicort didn't help. Uses Albuterol PRN but it helps minimally.

## 2018-11-10 ENCOUNTER — Other Ambulatory Visit: Payer: BC Managed Care – PPO

## 2018-11-10 ENCOUNTER — Ambulatory Visit: Payer: BC Managed Care – PPO | Admitting: Physician Assistant

## 2018-11-10 ENCOUNTER — Encounter: Payer: Self-pay | Admitting: Physician Assistant

## 2018-11-10 DIAGNOSIS — R0609 Other forms of dyspnea: Secondary | ICD-10-CM | POA: Insufficient documentation

## 2018-11-10 DIAGNOSIS — R05 Cough: Secondary | ICD-10-CM | POA: Insufficient documentation

## 2018-11-10 DIAGNOSIS — R053 Chronic cough: Secondary | ICD-10-CM | POA: Insufficient documentation

## 2018-11-12 ENCOUNTER — Ambulatory Visit (INDEPENDENT_AMBULATORY_CARE_PROVIDER_SITE_OTHER): Payer: BC Managed Care – PPO

## 2018-11-12 ENCOUNTER — Encounter: Payer: Self-pay | Admitting: Physician Assistant

## 2018-11-12 ENCOUNTER — Ambulatory Visit (INDEPENDENT_AMBULATORY_CARE_PROVIDER_SITE_OTHER): Payer: BC Managed Care – PPO | Admitting: Physician Assistant

## 2018-11-12 ENCOUNTER — Ambulatory Visit: Payer: BC Managed Care – PPO | Admitting: Physician Assistant

## 2018-11-12 ENCOUNTER — Other Ambulatory Visit: Payer: Self-pay

## 2018-11-12 VITALS — BP 122/70 | HR 69 | Ht 72.0 in | Wt 196.0 lb

## 2018-11-12 DIAGNOSIS — J3081 Allergic rhinitis due to animal (cat) (dog) hair and dander: Secondary | ICD-10-CM

## 2018-11-12 DIAGNOSIS — R0989 Other specified symptoms and signs involving the circulatory and respiratory systems: Secondary | ICD-10-CM | POA: Diagnosis not present

## 2018-11-12 DIAGNOSIS — J301 Allergic rhinitis due to pollen: Secondary | ICD-10-CM

## 2018-11-12 DIAGNOSIS — R0789 Other chest pain: Secondary | ICD-10-CM | POA: Diagnosis not present

## 2018-11-12 DIAGNOSIS — R0602 Shortness of breath: Secondary | ICD-10-CM | POA: Diagnosis not present

## 2018-11-12 DIAGNOSIS — J989 Respiratory disorder, unspecified: Secondary | ICD-10-CM | POA: Insufficient documentation

## 2018-11-12 DIAGNOSIS — R0609 Other forms of dyspnea: Secondary | ICD-10-CM

## 2018-11-12 DIAGNOSIS — J3489 Other specified disorders of nose and nasal sinuses: Secondary | ICD-10-CM

## 2018-11-12 LAB — PULMONARY FUNCTION TEST

## 2018-11-12 MED ORDER — MONTELUKAST SODIUM 10 MG PO TABS: 10.0000 mg | ORAL_TABLET | Freq: Every day | ORAL | 3 refills | Status: DC

## 2018-11-12 MED ORDER — FEXOFENADINE-PSEUDOEPHED ER 180-240 MG PO TB24: 1.0000 | ORAL_TABLET | Freq: Every day | ORAL | 11 refills | Status: DC

## 2018-11-12 NOTE — Progress Notes (Signed)
Subjective:    Patient ID: Casey Baird, male    DOB: [birth year], 28 y.o.   MRN: [id number]  HPI  Patient is a 28 year old male with shortness of breath on exertion and expiratory wheezing.  He presents to the clinic to have spirometry done.  He denies any chest pain or palpitations.  His CT of the chest was denied until he had EKG, chest x-ray, pulse ox, and spirometry.  He does have a long history of environmental and seasonal allergies.  He admits to getting a new dog in the last 6 months.  He has been on Clarinex and Flonase for at least a year.  He did try the Symbicort with no benefit.  Albuterol gives small amount of benefit.  .. Active Ambulatory Problems    Diagnosis Date Noted  . Chronic sinusitis 04/12/2012  . Anxiety and depression 04/12/2012  . 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 Hypertriglyceridemia 11/01/2012  . S/P tonsillectomy 04/18/2013  . Seasonal allergic rhinitis due to pollen 12/08/2016  . Fatigue 06/17/2017  . Non-restorative sleep 06/17/2017  . Snoring 06/17/2017  . 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 Chest tightness 08/03/2018  . Shortness of breath 08/03/2018  . Dyspnea on exertion 11/10/2018  . Persistent cough for 3 weeks or longer 11/10/2018  . Reactive airway disease that is not asthma 11/12/2018   Resolved Ambulatory Problems    Diagnosis Date Noted  . Calculus, tonsil 07/26/2012   No Additional Past Medical History       Review of Systems  All other systems reviewed and are negative.      Objective:   Physical Exam Vitals signs reviewed.  Constitutional:      Appearance: He is well-developed.  HENT:     Head: Normocephalic.  Neck:     Musculoskeletal: Normal range of motion.  Cardiovascular:     Rate and Rhythm: Normal rate and regular rhythm.     Heart sounds: No murmur.  Pulmonary:     Effort: Pulmonary effort is normal.     Breath sounds: Normal breath sounds.  Chest:     Chest wall: No tenderness.  Neurological:   General: No focal deficit present.     Mental Status: He is alert and oriented to person, place, and time.  Psychiatric:        Mood and Affect: Mood normal.           Assessment & Plan:  Marland KitchenMarland KitchenGraisyn was seen today for shortness of breath.  Diagnoses and all orders for this visit:  Dyspnea on exertion -     Spirometry with Graph; Future -     DG Chest 2 View  Shortness of breath -     Spirometry with Graph; Future -     DG Chest 2 View  Reactive airway disease that is not asthma -     montelukast (SINGULAIR) 10 MG tablet; Take 1 tablet (10 mg total) by mouth at bedtime. -     fexofenadine-pseudoephedrine (ALLEGRA-D 24) 180-240 MG 24 hr tablet; Take 1 tablet by mouth daily.  Chest tightness -     montelukast (SINGULAIR) 10 MG tablet; Take 1 tablet (10 mg total) by mouth at bedtime.  Animal dander allergy -     montelukast (SINGULAIR) 10 MG tablet; Take 1 tablet (10 mg total) by mouth at bedtime. -     fexofenadine-pseudoephedrine (ALLEGRA-D 24) 180-240 MG 24 hr tablet; Take 1 tablet by mouth daily.  Seasonal allergic rhinitis due to pollen -  montelukast (SINGULAIR) 10 MG tablet; Take 1 tablet (10 mg total) by mouth at bedtime. -     fexofenadine-pseudoephedrine (ALLEGRA-D 24) 180-240 MG 24 hr tablet; Take 1 tablet by mouth daily.  Sinus drainage -     fexofenadine-pseudoephedrine (ALLEGRA-D 24) 180-240 MG 24 hr tablet; Take 1 tablet by mouth daily.    Insurance denied CT of chest without EKG(NSR, no arrhthymias, no ST depression or elevation), CXR ordered today, pulse ox 100 percent, spirometry normal.  Will appeal CT chest.   FEV1 80 to 82 percent.  FEF25-75 15 percent change.   Overall normal test. FEV1 is right at the border of being normal but no real change with albuterol. Does not appear like asthma. With the change in small airways I do thing there is some inflammation there and maybe why albuterol helps. symbicort was not helping so ok to stop. Stop clarinex  and start allegra-D and flonase. Will add singulair as well. He did get a new dog which could be adding to his inflammation. Also he is not doing his allergy shots.

## 2018-11-12 NOTE — Patient Instructions (Signed)
Start singulair and allegra D.  Get CXR today.   Will call about CT.

## 2018-11-12 NOTE — Progress Notes (Signed)
CXR normal.

## 2018-11-15 ENCOUNTER — Telehealth: Payer: Self-pay
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
## 2018-11-19 ENCOUNTER — Encounter: Payer: Self-pay | Admitting: Physician Assistant

## 2018-11-19 ENCOUNTER — Other Ambulatory Visit: Payer: Self-pay

## 2018-11-19 ENCOUNTER — Ambulatory Visit (INDEPENDENT_AMBULATORY_CARE_PROVIDER_SITE_OTHER): Payer: BC Managed Care – PPO

## 2018-11-19 DIAGNOSIS — R0602 Shortness of breath: Secondary | ICD-10-CM

## 2018-11-19 DIAGNOSIS — R911 Solitary pulmonary nodule: Secondary | ICD-10-CM | POA: Insufficient documentation

## 2018-11-19 NOTE — Progress Notes (Signed)
CT show no reason for SOB. You do have 3 benign nodules on lungs and do not meet criteria for biopsy.  There was also sclerotic lesion around T11 spine but radiologist suspect benign and no further work up unless had any previous cancers or family hx of bone cancer.

## 2018-12-20 ENCOUNTER — Other Ambulatory Visit: Payer: Self-pay | Admitting: Physician Assistant

## 2018-12-20 DIAGNOSIS — J301 Allergic rhinitis due to pollen: Secondary | ICD-10-CM

## 2018-12-27 NOTE — Addendum Note (Signed)
Addended by: Alena Bills R on: 12/27/2018 03:29 PM   Modules accepted: Orders

## 2019-02-22 ENCOUNTER — Encounter: Payer: Self-pay | Admitting: Physician Assistant

## 2019-02-22 DIAGNOSIS — F419 Anxiety disorder, unspecified: Secondary | ICD-10-CM

## 2019-02-22 DIAGNOSIS — F329 Major depressive disorder, single episode, unspecified: Secondary | ICD-10-CM

## 2019-02-23 MED ORDER — CLONAZEPAM 0.5 MG PO TABS: 0.5000 mg | ORAL_TABLET | Freq: Two times a day (BID) | ORAL | 1 refills | Status: DC | PRN

## 2019-02-24 ENCOUNTER — Other Ambulatory Visit: Payer: Self-pay | Admitting: Neurology

## 2019-02-24 DIAGNOSIS — J301 Allergic rhinitis due to pollen: Secondary | ICD-10-CM

## 2019-02-24 MED ORDER — FLUTICASONE PROPIONATE 50 MCG/ACT NA SUSP: 2.0000 | Freq: Every day | NASAL | 1 refills | Status: DC | PRN

## 2019-03-15 ENCOUNTER — Ambulatory Visit: Payer: BC Managed Care – PPO | Attending: Internal Medicine

## 2019-03-15 ENCOUNTER — Other Ambulatory Visit: Payer: BC Managed Care – PPO

## 2019-03-15 DIAGNOSIS — Z20822 Contact with and (suspected) exposure to covid-19: Secondary | ICD-10-CM

## 2019-03-16 LAB — NOVEL CORONAVIRUS, NAA: SARS-CoV-2, NAA: NOT DETECTED

## 2019-05-29 ENCOUNTER — Ambulatory Visit: Payer: BC Managed Care – PPO | Attending: Internal Medicine

## 2019-05-29 DIAGNOSIS — Z23 Encounter for immunization: Secondary | ICD-10-CM

## 2019-05-29 NOTE — Progress Notes (Signed)
   Covid-19 Vaccination Clinic  Name:  Casey Baird    MRN: [id number] DOB: 05-Jan-1991  05/29/2019  [name] was observed post Covid-19 immunization for 15 minutes without incident. He was provided with Vaccine Information Sheet and instruction to access the V-Safe system.   [name] was instructed to call 911 with any severe reactions post vaccine: Marland Kitchen Difficulty breathing  . Swelling of face and throat  . A fast heartbeat  . A bad rash all over body  . Dizziness and weakness   Immunizations Administered    Name Date Dose VIS Date Route   Pfizer COVID-19 Vaccine 05/29/2019 12:15 PM 0.3 mL 03/04/2019 Intramuscular   Manufacturer: [place]   Lot: [id number]   [place]: [id number]

## 2019-06-20 ENCOUNTER — Encounter (HOSPITAL_COMMUNITY): Payer: Self-pay

## 2019-06-20 ENCOUNTER — Other Ambulatory Visit: Payer: Self-pay

## 2019-06-20 ENCOUNTER — Emergency Department (HOSPITAL_COMMUNITY): Payer: BC Managed Care – PPO

## 2019-06-20 ENCOUNTER — Emergency Department (HOSPITAL_COMMUNITY)
Admission: EM | Admit: 2019-06-20 | Discharge: 2019-06-20 | Disposition: A | Discharge status: Home / Self Care | Payer: BC Managed Care – PPO | Attending: Emergency Medicine | Admitting: Emergency Medicine

## 2019-06-20 DIAGNOSIS — Y9389 Activity, other specified: Secondary | ICD-10-CM | POA: Diagnosis not present

## 2019-06-20 DIAGNOSIS — S022XXA Fracture of nasal bones, initial encounter for closed fracture: Secondary | ICD-10-CM | POA: Diagnosis not present

## 2019-06-20 DIAGNOSIS — Z79899 Other long term (current) drug therapy: Secondary | ICD-10-CM | POA: Insufficient documentation

## 2019-06-20 DIAGNOSIS — S060X0A Concussion without loss of consciousness, initial encounter: Secondary | ICD-10-CM | POA: Diagnosis not present

## 2019-06-20 DIAGNOSIS — S0990XA Unspecified injury of head, initial encounter: Secondary | ICD-10-CM | POA: Diagnosis present

## 2019-06-20 DIAGNOSIS — Y9289 Other specified places as the place of occurrence of the external cause: Secondary | ICD-10-CM | POA: Diagnosis not present

## 2019-06-20 DIAGNOSIS — Y999 Unspecified external cause status: Secondary | ICD-10-CM | POA: Diagnosis not present
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
## 2019-06-20 NOTE — ED Notes (Signed)
Pt verbalized discharge instructions and is  A/O and ambulatory upon discharge.

## 2019-06-21 ENCOUNTER — Encounter: Payer: Self-pay | Admitting: Physician Assistant

## 2019-06-22 ENCOUNTER — Ambulatory Visit: Payer: BC Managed Care – PPO | Attending: Internal Medicine

## 2019-06-22 DIAGNOSIS — Z23 Encounter for immunization: Secondary | ICD-10-CM

## 2019-06-22 NOTE — Progress Notes (Signed)
   Covid-19 Vaccination Clinic  Name:  Casey Baird    MRN: [id number] DOB: June 07, 1990  06/22/2019  [name] was observed post Covid-19 immunization for 15 minutes without incident. He was provided with Vaccine Information Sheet and instruction to access the V-Safe system.   [name] was instructed to call 911 with any severe reactions post vaccine: Marland Kitchen Difficulty breathing  . Swelling of face and throat  . A fast heartbeat  . A bad rash all over body  . Dizziness and weakness   Immunizations Administered    Name Date Dose VIS Date Route   Pfizer COVID-19 Vaccine 06/22/2019 10:21 AM 0.3 mL 03/04/2019 Intramuscular   Manufacturer: [place]   Lot: [id number]   [place]: [id number]

## 2019-07-20 ENCOUNTER — Encounter: Payer: BC Managed Care – PPO | Admitting: Family Medicine

## 2019-09-07 ENCOUNTER — Other Ambulatory Visit: Payer: Self-pay | Admitting: Neurology

## 2019-09-07 DIAGNOSIS — J301 Allergic rhinitis due to pollen: Secondary | ICD-10-CM

## 2019-09-07 MED ORDER — FLUTICASONE PROPIONATE 50 MCG/ACT NA SUSP: 2.0000 | Freq: Every day | NASAL | 1 refills | Status: DC | PRN

## 2019-09-27 ENCOUNTER — Ambulatory Visit (INDEPENDENT_AMBULATORY_CARE_PROVIDER_SITE_OTHER): Payer: BC Managed Care – PPO | Admitting: Physician Assistant

## 2019-09-27 ENCOUNTER — Encounter: Payer: Self-pay | Admitting: Physician Assistant

## 2019-09-27 VITALS — BP 131/82 | HR 69 | Ht 72.0 in | Wt 209.0 lb

## 2019-09-27 DIAGNOSIS — F419 Anxiety disorder, unspecified: Secondary | ICD-10-CM

## 2019-09-27 DIAGNOSIS — Z0000 Encounter for general adult medical examination without abnormal findings: Secondary | ICD-10-CM | POA: Diagnosis not present

## 2019-09-27 DIAGNOSIS — Z131 Encounter for screening for diabetes mellitus: Secondary | ICD-10-CM | POA: Diagnosis not present

## 2019-09-27 DIAGNOSIS — Z789 Other specified health status: Secondary | ICD-10-CM | POA: Insufficient documentation

## 2019-09-27 DIAGNOSIS — F329 Major depressive disorder, single episode, unspecified: Secondary | ICD-10-CM

## 2019-09-27 DIAGNOSIS — Z7289 Other problems related to lifestyle: Secondary | ICD-10-CM

## 2019-09-27 DIAGNOSIS — Z13220 Encounter for screening for lipoid disorders: Secondary | ICD-10-CM

## 2019-09-27 LAB — COMPLETE METABOLIC PANEL WITH GFR
AG Ratio: 2.1 (calc) (ref 1.0–2.5)
ALT: 35 U/L (ref 9–46)
AST: 19 U/L (ref 10–40)
Albumin: 4.6 g/dL (ref 3.6–5.1)
Alkaline phosphatase (APISO): 46 U/L (ref 36–130)
BUN: 11 mg/dL (ref 7–25)
CO2: 29 mmol/L (ref 20–32)
Calcium: 9.7 mg/dL (ref 8.6–10.3)
Chloride: 106 mmol/L (ref 98–110)
Creat: 1.12 mg/dL (ref 0.60–1.35)
GFR, Est African American: 101 mL/min/{1.73_m2} (ref >=60)
GFR, Est Non African American: 87 mL/min/{1.73_m2} (ref >=60)
Globulin: 2.2 g/dL (calc) (ref 1.9–3.7)
Glucose, Bld: 90 mg/dL (ref 65–99)
Potassium: 4.4 mmol/L (ref 3.5–5.3)
Sodium: 140 mmol/L (ref 135–146)
Total Bilirubin: 0.5 mg/dL (ref 0.2–1.2)
Total Protein: 6.8 g/dL (ref 6.1–8.1)

## 2019-09-27 LAB — LIPID PANEL W/REFLEX DIRECT LDL
Cholesterol: 199 mg/dL (ref <200)
HDL: 42 mg/dL (ref >=40)
LDL Cholesterol (Calc): 122 mg/dL (calc) — ABNORMAL HIGH
Non-HDL Cholesterol (Calc): 157 mg/dL (calc) — ABNORMAL HIGH (ref <130)
Total CHOL/HDL Ratio: 4.7 (calc) (ref <5.0)
Triglycerides: 230 mg/dL — ABNORMAL HIGH (ref <150)

## 2019-09-27 MED ORDER — CLONAZEPAM 0.5 MG PO TABS: 0.5000 mg | ORAL_TABLET | Freq: Two times a day (BID) | ORAL | 1 refills | Status: DC | PRN

## 2019-09-27 MED ORDER — ESCITALOPRAM OXALATE 10 MG PO TABS: 10.0000 mg | ORAL_TABLET | Freq: Every day | ORAL | 3 refills | Status: DC

## 2019-09-27 NOTE — Patient Instructions (Signed)
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
## 2019-09-28 NOTE — Progress Notes (Signed)
[name],   Triglycerides are up at 230 with goal under 150.  LDL is fair at 122. Optimal is under 100. Work on weight loss avoid excessive sugars/carbs/processed foods. Could start a daily fish oil 4000mg  to help with TG. Recheck in 6 months.   Kidney, liver, glucose look GREAT.

## 2019-11-19 ENCOUNTER — Encounter: Payer: Self-pay | Admitting: Physician Assistant

## 2020-02-24 ENCOUNTER — Other Ambulatory Visit: Payer: Self-pay | Admitting: Neurology

## 2020-02-24 DIAGNOSIS — F32A Depression, unspecified: Secondary | ICD-10-CM

## 2020-02-24 MED ORDER — CLONAZEPAM 0.5 MG PO TABS: 0.5000 mg | ORAL_TABLET | Freq: Two times a day (BID) | ORAL | 0 refills | Status: DC | PRN

## 2020-02-24 NOTE — Telephone Encounter (Signed)
Patient left vm stating he recently moved and has transferred prescriptions to [place] in [facility], but Clonazepam would not transfer. He is asking for new RX to be sent.   Last written 09/27/2019 #180 with 1 refill Last appt 09/27/2019. Please advise. RX pended.

## 2020-03-29 DIAGNOSIS — Z20822 Contact with and (suspected) exposure to covid-19: Secondary | ICD-10-CM | POA: Diagnosis not present

## 2020-03-29 DIAGNOSIS — B349 Viral infection, unspecified: Secondary | ICD-10-CM | POA: Diagnosis not present

## 2020-06-09 ENCOUNTER — Other Ambulatory Visit: Payer: Self-pay | Admitting: Physician Assistant

## 2020-06-09 DIAGNOSIS — F419 Anxiety disorder, unspecified: Secondary | ICD-10-CM

## 2020-06-11 NOTE — Telephone Encounter (Signed)
Last written 02/24/2020 #180 no refills Last visit 09/27/2019, pended for one month with note that patient needs appt.

## 2020-07-05 ENCOUNTER — Telehealth (INDEPENDENT_AMBULATORY_CARE_PROVIDER_SITE_OTHER): Payer: BC Managed Care – PPO | Admitting: Physician Assistant

## 2020-07-05 DIAGNOSIS — F32A Depression, unspecified: Secondary | ICD-10-CM | POA: Diagnosis not present

## 2020-07-05 DIAGNOSIS — F419 Anxiety disorder, unspecified: Secondary | ICD-10-CM | POA: Diagnosis not present

## 2020-07-05 MED ORDER — CLONAZEPAM 0.5 MG PO TABS: 0.5000 mg | ORAL_TABLET | Freq: Two times a day (BID) | ORAL | 5 refills | Status: DC | PRN

## 2020-07-05 MED ORDER — ESCITALOPRAM OXALATE 10 MG PO TABS: 10.0000 mg | ORAL_TABLET | Freq: Every day | ORAL | 1 refills | Status: DC

## 2020-07-05 MED ORDER — BUSPIRONE HCL 7.5 MG PO TABS: 7.5000 mg | ORAL_TABLET | Freq: Two times a day (BID) | ORAL | 5 refills | Status: DC

## 2020-07-05 NOTE — Progress Notes (Signed)
..Virtual Visit via Video Note  I connected with Casey Baird on 07/05/2020 at  1:00 PM EDT by a video enabled telemedicine application and verified that I am speaking with the correct person using two identifiers.  Location: Patient: car Provider: clinic  .Marland KitchenParticipating in visit:  Patient: Casey Baird Provider: Iran Planas PA-c   I discussed the limitations of evaluation and management by telemedicine and the availability of in person appointments. The patient expressed understanding and agreed to proceed.  History of Present Illness: Pt is a 30 yo male with anxiety and depression who needs refills on medication.   [place] moved [place] job. It is pretty stressful and he does feel like his anxiety is worsening. He feels more on edge and the need to take more klonapin than the two tablets daily to make it through the day. He has a lot of responsibility with new job.  He has tried going up on lexapro before and just make him too sleepy. He feels like he needs to do something. Not in counseling. No SI/HC.    Marland Kitchen. Active Ambulatory Problems    Diagnosis Date Noted  . Chronic sinusitis 04/12/2012  . Anxiety and depression 04/12/2012  . 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 Hypertriglyceridemia 11/01/2012  . S/P tonsillectomy 04/18/2013  . Seasonal allergic rhinitis due to pollen 12/08/2016  . Fatigue 06/17/2017  . Non-restorative sleep 06/17/2017  . Snoring 06/17/2017  . 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 Chest tightness 08/03/2018  . Shortness of breath 08/03/2018  . Dyspnea on exertion 11/10/2018  . Reactive airway disease that is not asthma 11/12/2018  . Pulmonary nodule 11/19/2018  . Alcohol use 09/27/2019   Resolved Ambulatory Problems    Diagnosis Date Noted  . Calculus, tonsil 07/26/2012  . Persistent cough for 3 weeks or longer 11/10/2018   No Additional Past Medical History   Reviewed med, allergy and problem list.   Observations/Objective: No acute  distress.  Normal mood and appearance.   Marland Kitchen.There were no vitals filed for this visit. There is no height or weight on file to calculate BMI.  .. Depression screen [facility] 2/9 07/09/2020 09/27/2019 08/02/2018 06/17/2017 12/08/2016  Decreased Interest 0 1 2 0 2  Down, Depressed, Hopeless 0 1 2 0 1  PHQ - 2 Score 0 2 4 0 3  Altered sleeping 0 1 2 1 1   Tired, decreased energy 1 1 1 1 1   Change in appetite 0 0 1 0 1  Feeling bad or failure about yourself  0 0 2 0 1  Trouble concentrating 1 0 2 0 0  Moving slowly or fidgety/restless 0 0 0 0 0  Suicidal thoughts - 0 1 0 0  PHQ-9 Score 2 4 13 2 7   Difficult doing work/chores Not difficult at all Not difficult at all Somewhat difficult Not difficult at all -   .. GAD 7 : Generalized Anxiety Score 07/09/2020 09/27/2019 08/02/2018 06/17/2017  Nervous, Anxious, on Edge 2 2 3 1   Control/stop worrying 2 2 2 1   Worry too much - different things 2 2 2 1   Trouble relaxing 2 2 3 1   Restless 2 0 0 0  Easily annoyed or irritable 2 0 2 0  Afraid - awful might happen 2 0 2 0  Total GAD 7 Score 14 8 14 4   Anxiety Difficulty Very difficult Somewhat difficult Somewhat difficult Somewhat difficult       Assessment and Plan: Marland KitchenMarland KitchenDeondrae was seen today for medication  refill.  Diagnoses and all orders for this visit:  Anxiety and depression -     clonazePAM (KLONOPIN) 0.5 MG tablet; Take 1 tablet (0.5 mg total) by mouth 2 (two) times daily as needed. -     escitalopram (LEXAPRO) 10 MG tablet; Take 1 tablet (10 mg total) by mouth daily. -     busPIRone (BUSPAR) 7.5 MG tablet; Take 1 tablet (7.5 mg total) by mouth 2 (two) times daily. -     Ambulatory referral to Psychiatry -     Ambulatory referral to Psychology   PHQ controlled.  GAD numbers up.  Referral for local [place] and counseling.  Continue on lexapro.  Added buspar.  Continue klonapin but no more than twice a day.  Discussed deep breathing and good sleep.    Follow Up Instructions:    I discussed  the assessment and treatment plan with the patient. The patient was provided an opportunity to ask questions and all were answered. The patient agreed with the plan and demonstrated an understanding of the instructions.   The patient was advised to call back or seek an in-person evaluation if the symptoms worsen or if the condition fails to improve as anticipated.    Iran Planas, PA-C

## 2020-07-09 ENCOUNTER — Encounter: Payer: Self-pay | Admitting: Physician Assistant

## 2020-09-18 ENCOUNTER — Other Ambulatory Visit: Payer: Self-pay | Admitting: Physician Assistant

## 2020-09-18 DIAGNOSIS — J301 Allergic rhinitis due to pollen: Secondary | ICD-10-CM

## 2020-11-23 DIAGNOSIS — Z20822 Contact with and (suspected) exposure to covid-19: Secondary | ICD-10-CM | POA: Diagnosis not present

## 2021-01-01 ENCOUNTER — Other Ambulatory Visit: Payer: Self-pay | Admitting: Physician Assistant

## 2021-01-01 ENCOUNTER — Encounter: Payer: Self-pay | Admitting: Physician Assistant

## 2021-01-01 DIAGNOSIS — F32A Depression, unspecified: Secondary | ICD-10-CM

## 2021-01-01 DIAGNOSIS — F419 Anxiety disorder, unspecified: Secondary | ICD-10-CM

## 2021-01-22 ENCOUNTER — Other Ambulatory Visit: Payer: Self-pay | Admitting: Physician Assistant

## 2021-01-22 DIAGNOSIS — F419 Anxiety disorder, unspecified: Secondary | ICD-10-CM

## 2021-01-22 DIAGNOSIS — F32A Depression, unspecified: Secondary | ICD-10-CM

## 2021-01-22 NOTE — Telephone Encounter (Signed)
Last written 07/05/2020 #60 with 5 refills Last appt 07/05/2020

## 2021-03-18 ENCOUNTER — Other Ambulatory Visit: Payer: Self-pay | Admitting: Physician Assistant

## 2021-03-18 DIAGNOSIS — F419 Anxiety disorder, unspecified: Secondary | ICD-10-CM

## 2021-04-15 ENCOUNTER — Encounter: Payer: Self-pay | Admitting: Physician Assistant

## 2021-04-15 ENCOUNTER — Telehealth (INDEPENDENT_AMBULATORY_CARE_PROVIDER_SITE_OTHER): Payer: BC Managed Care – PPO | Admitting: Physician Assistant

## 2021-04-15 VITALS — Ht 72.0 in | Wt 210.0 lb

## 2021-04-15 DIAGNOSIS — J301 Allergic rhinitis due to pollen: Secondary | ICD-10-CM | POA: Diagnosis not present

## 2021-04-15 DIAGNOSIS — F32A Depression, unspecified: Secondary | ICD-10-CM

## 2021-04-15 DIAGNOSIS — F419 Anxiety disorder, unspecified: Secondary | ICD-10-CM | POA: Diagnosis not present

## 2021-04-15 MED ORDER — CLONAZEPAM 0.5 MG PO TABS: 0.5000 mg | ORAL_TABLET | Freq: Two times a day (BID) | ORAL | 5 refills | Status: DC

## 2021-04-15 MED ORDER — FLUTICASONE PROPIONATE 50 MCG/ACT NA SUSP: NASAL | 3 refills | Status: DC

## 2021-04-15 MED ORDER — BUSPIRONE HCL 7.5 MG PO TABS: 7.5000 mg | ORAL_TABLET | Freq: Three times a day (TID) | ORAL | 3 refills | Status: DC

## 2021-04-15 MED ORDER — ESCITALOPRAM OXALATE 10 MG PO TABS: ORAL_TABLET | ORAL | 3 refills | Status: DC

## 2021-04-15 NOTE — Progress Notes (Signed)
..Virtual Visit via Video Note  I connected with [name] on 04/16/21 at  3:40 PM EST by a video enabled telemedicine application and verified that I am speaking with the correct person using two identifiers.  Location: Patient: car Provider: clinic  .Marland KitchenParticipating in visit:  Patient: Casey Baird Provider: Iran Planas PA-C Provider in training: Judithann Sheen PA-S   I discussed the limitations of evaluation and management by telemedicine and the availability of in person appointments. The patient expressed understanding and agreed to proceed.  History of Present Illness: Pt is a 31 yo male with Anxiety and Depression who calls in for medication refills. He is doing ok with mood right now. His work is very stressful. He seems to not do as well in winter as he does in spring/summer/fall. No SI/HC. He is taking his medication daily. Not in counseling.   .. 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   Hypertriglyceridemia 11/01/2012   S/P tonsillectomy 04/18/2013   Seasonal allergic rhinitis due to pollen 12/08/2016   Fatigue 06/17/2017   Non-restorative sleep 06/17/2017   Snoring 06/17/2017   Animal dander allergy 05/18/2018   Chest tightness 08/03/2018   Shortness of breath 08/03/2018   Dyspnea on exertion 11/10/2018   Reactive airway disease that is not asthma 11/12/2018   Pulmonary nodule 11/19/2018   Alcohol use 09/27/2019   Resolved Ambulatory Problems    Diagnosis Date Noted   Calculus, tonsil 07/26/2012   Persistent cough for 3 weeks or longer 11/10/2018   No Additional Past Medical History     Observations/Objective: No acute distress Normal mood and appearance Normal breathing  .Marland Kitchen Today's Vitals   04/15/21 1535  Weight: 210 lb (95.3 kg)  Height: 6' (1.829 m)   Body mass index is 28.48 kg/m.  .. Depression screen [facility] 2/9 04/15/2021 07/09/2020  09/27/2019 08/02/2018 06/17/2017  Decreased Interest 2 0 1 2 0  Down, Depressed, Hopeless 3 0 1 2 0  PHQ - 2 Score 5 0 2 4 0  Altered sleeping 1 0 1 2 1   Tired, decreased energy 2 1 1 1 1   Change in appetite 2 0 0 1 0  Feeling bad or failure about yourself  3 0 0 2 0  Trouble concentrating - 1 0 2 0  Moving slowly or fidgety/restless 2 0 0 0 0  Suicidal thoughts 0 - 0 1 0  PHQ-9 Score 15 2 4 13 2   Difficult doing work/chores - Not difficult at all Not difficult at all Somewhat difficult Not difficult at all   .Marland Kitchen GAD 7 : Generalized Anxiety Score 04/15/2021 07/09/2020 09/27/2019 08/02/2018  Nervous, Anxious, on Edge 3 2 2 3   Control/stop worrying 3 2 2 2   Worry too much - different things 3 2 2 2   Trouble relaxing 3 2 2 3   Restless 2 2 0 0  Easily annoyed or irritable 1 2 0 2  Afraid - awful might happen 1 2 0 2  Total GAD 7 Score 16 14 8 14   Anxiety Difficulty - Very difficult Somewhat difficult Somewhat difficult     Assessment and Plan: Marland KitchenMarland KitchenFawaz was seen today for medication refill.  Diagnoses and all orders for this visit:  Anxiety and depression -     clonazePAM (KLONOPIN) 0.5 MG tablet; Take 1 tablet (0.5 mg total) by mouth 2 (two) times daily. -     escitalopram (LEXAPRO)  10 MG tablet; Take one and one-half tablets day. -     busPIRone (BUSPAR) 7.5 MG tablet; Take 1 tablet (7.5 mg total) by mouth 3 (three) times daily.  Seasonal allergic rhinitis due to pollen -     fluticasone (FLONASE) 50 MCG/ACT nasal spray; USE 2 SPRAYS IN EACH NOSTRIL ONCE A DAY AS NEEDED FOR ALLERGIES OR RHINITIS Strength: 50 MCG/ACT  PHQ/GAD numbers are up For the winter increase lexapro to 1 and 1/2 and buspar to up to 3 times a day. Consider getting back in with counseling. Make sure walking/mediating. Continue to use klonapin no more than twice a day.  Refilled flonase.  Follow up in 6 months.     Follow Up Instructions:    I discussed the assessment and treatment plan with the patient.  The patient was provided an opportunity to ask questions and all were answered. The patient agreed with the plan and demonstrated an understanding of the instructions.   The patient was advised to call back or seek an in-person evaluation if the symptoms worsen or if the condition fails to improve as anticipated.    Iran Planas, PA-C

## 2021-04-16 ENCOUNTER — Encounter: Payer: Self-pay | Admitting: Physician Assistant

## 2021-07-04 DIAGNOSIS — F419 Anxiety disorder, unspecified: Secondary | ICD-10-CM | POA: Diagnosis not present

## 2021-07-04 IMAGING — CT CT CHEST WITHOUT CONTRAST
2 of 3 series · 15 of 36 positions shown, 18 images · non-contrast
Comparison: Radiograph of 11/12/2018

CLINICAL DATA: Exertional shortness of breath over the last 9
months.

EXAM:
CT CHEST WITHOUT CONTRAST
TECHNIQUE: Multidetector CT imaging of the chest was performed following the
standard protocol without IV contrast.

[Series 2: thorax · axial · 0.79mm/px · z∈[-306,-41]mm · 12 of 63 slices shown, 15 images]
[im 5/63  mediastinal]
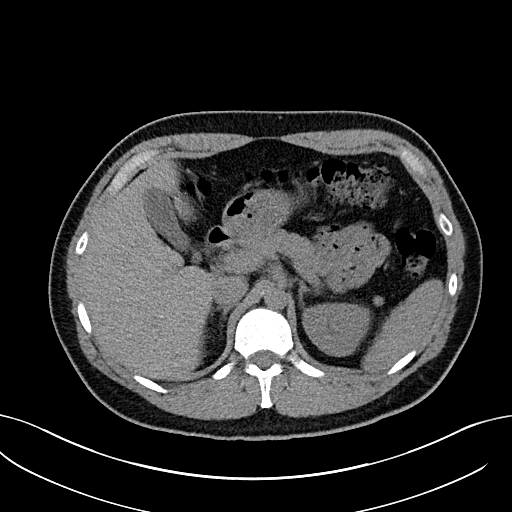
[im 5/63  lung]
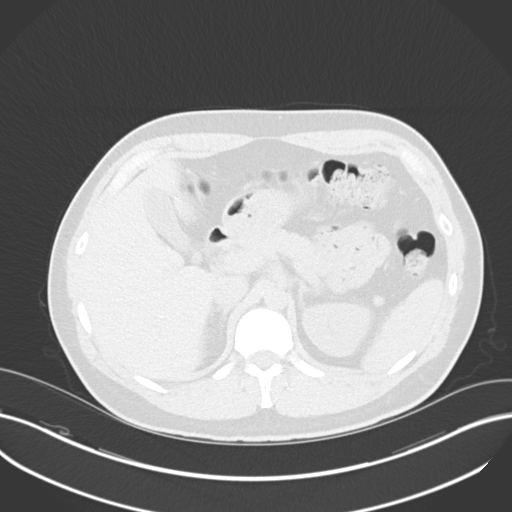
[im 10/63  lung]
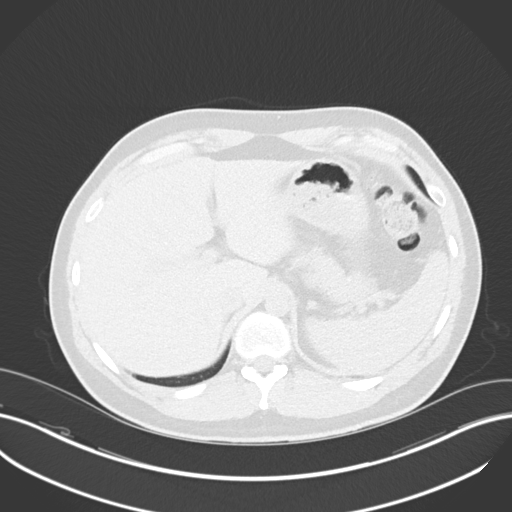
[im 14/63  lung]
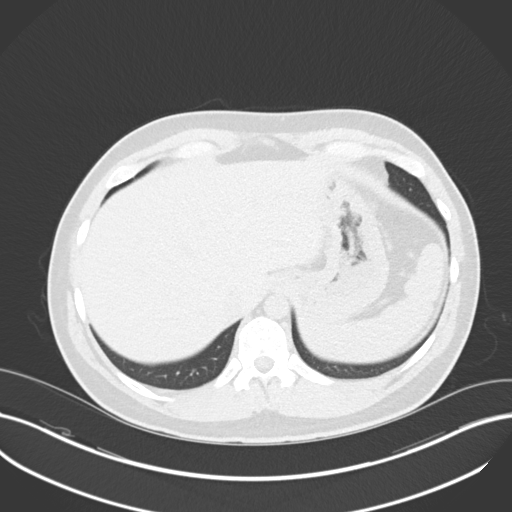
[im 19/63  lung]
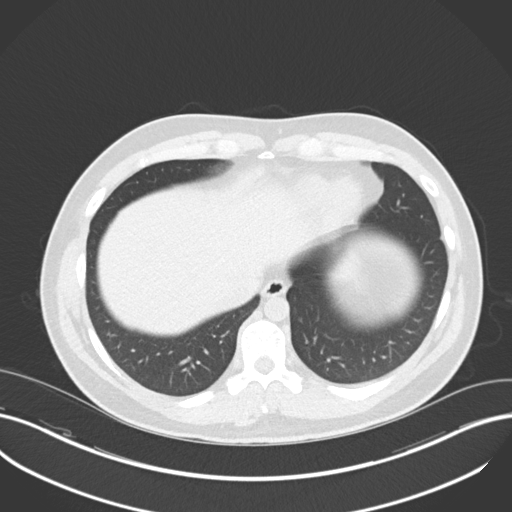
[im 23/63  mediastinal]
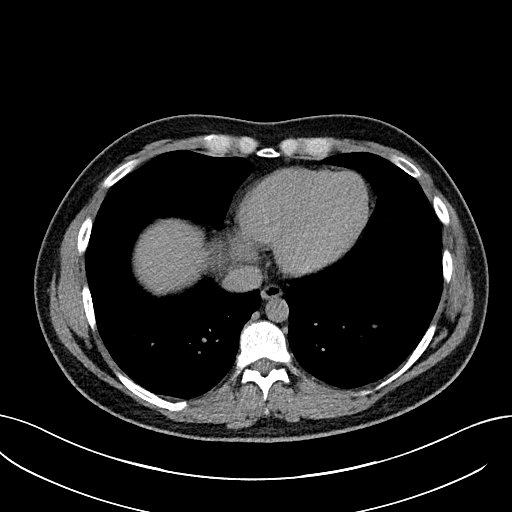
[im 23/63  lung]
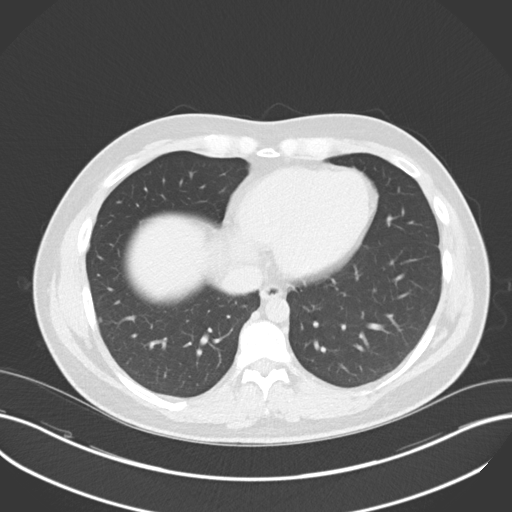
[im 28/63  lung]
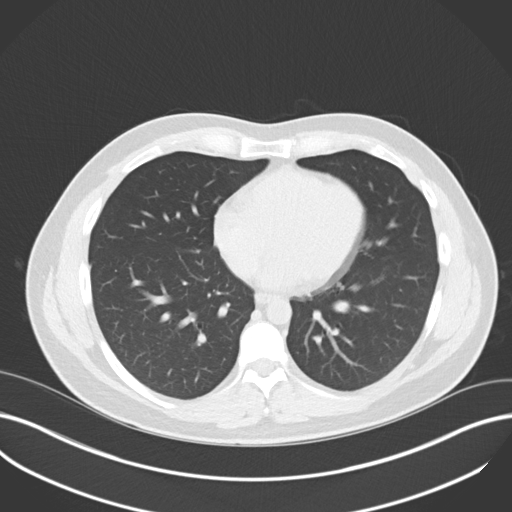
[im 35/63  lung]
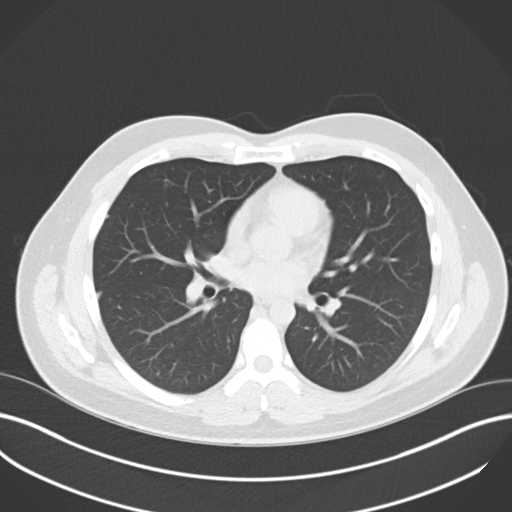
[im 40/63  lung]
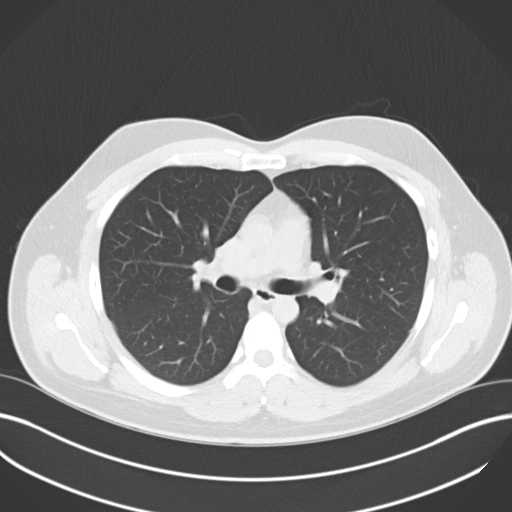
[im 44/63  mediastinal]
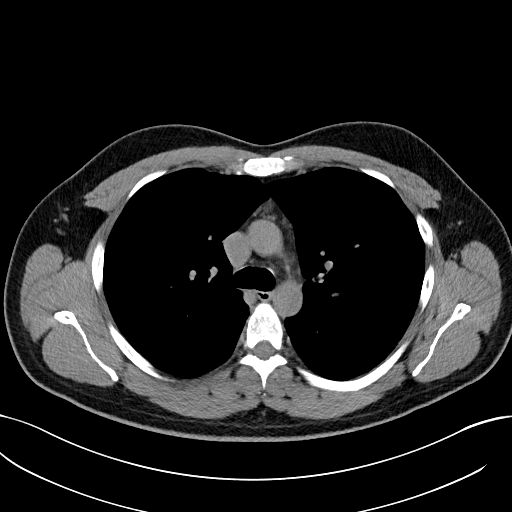
[im 44/63  lung]
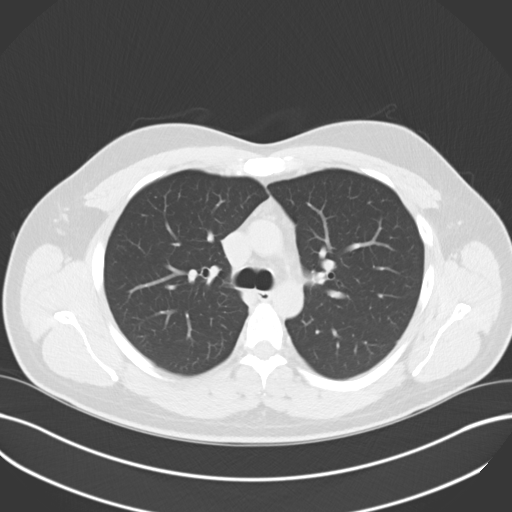
[im 49/63  lung]
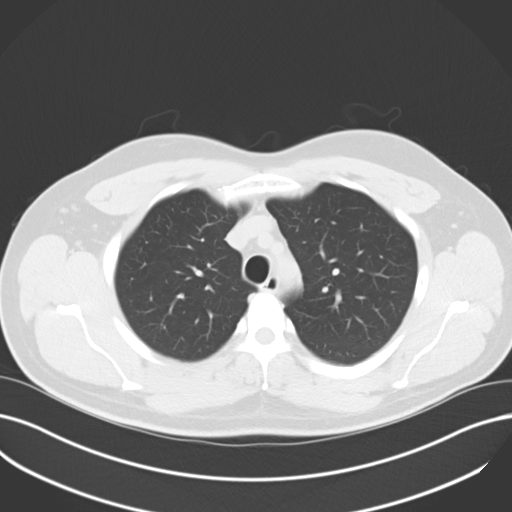
[im 53/63  lung]
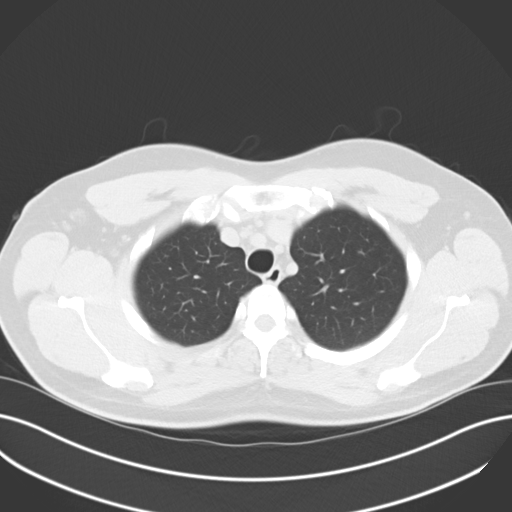
[im 58/63  lung]
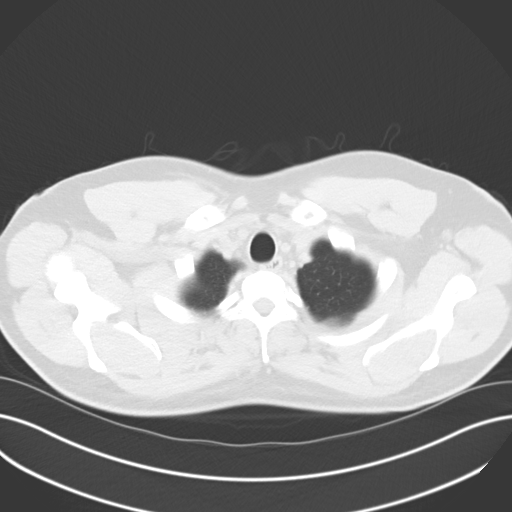

[Series 5: coronal · coronal · 0.66mm/px · 3 of 130 slices shown]
[im 26/130  lung]
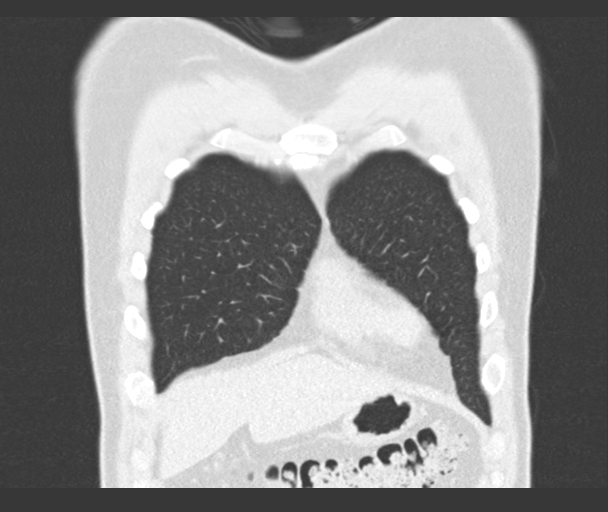
[im 52/130  lung]
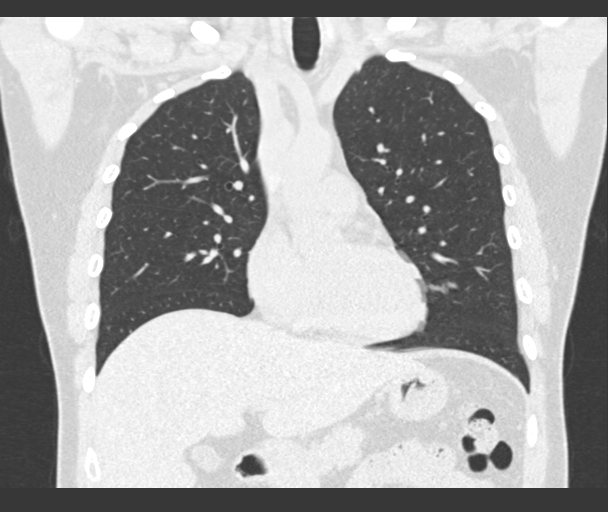
[im 78/130  lung]
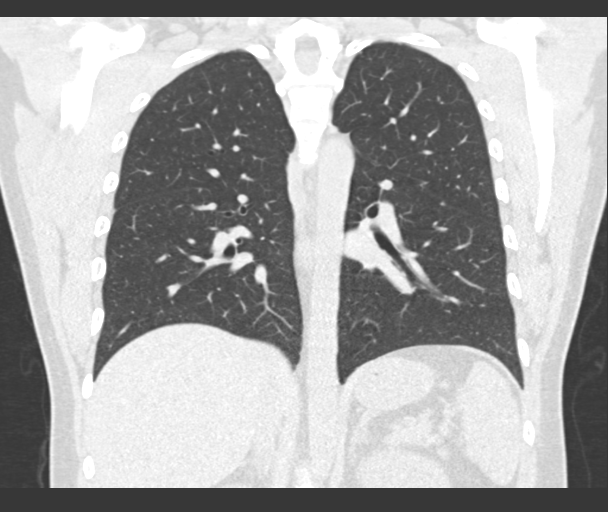

[15 of 36 positions shown; findings below may reference images not displayed]

FINDINGS: Cardiovascular: Unremarkable

Mediastinum/Nodes: Unremarkable

Lungs/Pleura: 3 mm subpleural nodule in the right middle lobe on
image 71/3. 3 mm in diameter subpleural nodule in the right lower
lobe on image 100/3. 3 mm left upper lobe nodule, image 65/3. The
lungs appear otherwise unremarkable. A significant airway thickening
is identified. No significant bronchiectasis.

Upper Abdomen: Unremarkable

Musculoskeletal: 6 mm rim sclerotic lesion in the left T11 pedicle,
image 123/5.
IMPRESSION: 1. A cause for the patient's exertional shortness of breath is not
identified.
2. 3 pulmonary nodules each measure 3 mm in diameter. Fleischner
criteria do not apply due to the patient's young age, these are
extremely likely to be benign and do not require any follow up.
3. Small rim sclerotic lesion in the left T11 pedicle. This is
highly likely to be benign/incidental and outside of special
clinical circumstance such as a significant history of malignancy,
this does not require further workup.

## 2021-07-09 DIAGNOSIS — F419 Anxiety disorder, unspecified: Secondary | ICD-10-CM | POA: Diagnosis not present

## 2021-07-29 DIAGNOSIS — L739 Follicular disorder, unspecified: Secondary | ICD-10-CM | POA: Diagnosis not present

## 2021-08-05 DIAGNOSIS — F419 Anxiety disorder, unspecified: Secondary | ICD-10-CM | POA: Diagnosis not present

## 2021-08-12 DIAGNOSIS — F419 Anxiety disorder, unspecified: Secondary | ICD-10-CM | POA: Diagnosis not present

## 2021-08-15 ENCOUNTER — Other Ambulatory Visit: Payer: Self-pay | Admitting: Physician Assistant

## 2021-08-15 DIAGNOSIS — K649 Unspecified hemorrhoids: Secondary | ICD-10-CM | POA: Diagnosis not present

## 2021-08-15 DIAGNOSIS — J301 Allergic rhinitis due to pollen: Secondary | ICD-10-CM

## 2021-08-26 DIAGNOSIS — F419 Anxiety disorder, unspecified: Secondary | ICD-10-CM | POA: Diagnosis not present

## 2021-09-02 DIAGNOSIS — F419 Anxiety disorder, unspecified: Secondary | ICD-10-CM | POA: Diagnosis not present

## 2021-09-09 DIAGNOSIS — F419 Anxiety disorder, unspecified: Secondary | ICD-10-CM | POA: Diagnosis not present

## 2021-09-16 ENCOUNTER — Other Ambulatory Visit: Payer: Self-pay | Admitting: Physician Assistant

## 2021-09-16 DIAGNOSIS — F419 Anxiety disorder, unspecified: Secondary | ICD-10-CM | POA: Diagnosis not present

## 2021-09-16 DIAGNOSIS — F32A Depression, unspecified: Secondary | ICD-10-CM

## 2021-09-23 DIAGNOSIS — F419 Anxiety disorder, unspecified: Secondary | ICD-10-CM | POA: Diagnosis not present

## 2021-10-07 DIAGNOSIS — F419 Anxiety disorder, unspecified: Secondary | ICD-10-CM | POA: Diagnosis not present

## 2021-12-02 ENCOUNTER — Other Ambulatory Visit: Payer: Self-pay | Admitting: Physician Assistant

## 2021-12-02 DIAGNOSIS — Z131 Encounter for screening for diabetes mellitus: Secondary | ICD-10-CM

## 2021-12-02 DIAGNOSIS — F32A Depression, unspecified: Secondary | ICD-10-CM

## 2021-12-02 DIAGNOSIS — Z1329 Encounter for screening for other suspected endocrine disorder: Secondary | ICD-10-CM

## 2021-12-02 DIAGNOSIS — Z0000 Encounter for general adult medical examination without abnormal findings: Secondary | ICD-10-CM

## 2021-12-02 DIAGNOSIS — E781 Pure hyperglyceridemia: Secondary | ICD-10-CM

## 2021-12-02 NOTE — Telephone Encounter (Signed)
Patient is scheduled for an annual physical on 10/20 but has ran out of his Klonopin and asked could a refill be sent to last until then.  Patient would also like to come in for fasting labs before appt as well.

## 2021-12-03 ENCOUNTER — Other Ambulatory Visit: Payer: Self-pay | Admitting: Physician Assistant

## 2021-12-03 DIAGNOSIS — F32A Depression, unspecified: Secondary | ICD-10-CM

## 2021-12-03 MED ORDER — CLONAZEPAM 0.5 MG PO TABS: 0.5000 mg | ORAL_TABLET | Freq: Two times a day (BID) | ORAL | 0 refills | Status: DC

## 2021-12-03 NOTE — Telephone Encounter (Signed)
Labs pended, sign if appropriate.   Clonazepam last written 09/16/2021 #60 no refills, told he needed appt before refills at that time Last appt 04/15/2021, please advise. (Pended)

## 2021-12-24 DIAGNOSIS — F419 Anxiety disorder, unspecified: Secondary | ICD-10-CM | POA: Diagnosis not present

## 2021-12-30 DIAGNOSIS — F419 Anxiety disorder, unspecified: Secondary | ICD-10-CM | POA: Diagnosis not present

## 2022-01-01 ENCOUNTER — Other Ambulatory Visit: Payer: Self-pay | Admitting: Physician Assistant

## 2022-01-01 ENCOUNTER — Encounter: Payer: Self-pay | Admitting: Physician Assistant

## 2022-01-01 DIAGNOSIS — F419 Anxiety disorder, unspecified: Secondary | ICD-10-CM

## 2022-01-01 MED ORDER — CLONAZEPAM 0.5 MG PO TABS: 0.5000 mg | ORAL_TABLET | Freq: Two times a day (BID) | ORAL | 0 refills | Status: DC

## 2022-01-01 NOTE — Telephone Encounter (Signed)
Pt requesting a Klonopin refill to last until his CPE appt on 01/10/22

## 2022-01-01 NOTE — Telephone Encounter (Signed)
It looks like the labs were ordered a month ago so I think he is good on that part.  Please see separate phone note for prescription refill information.

## 2022-01-10 ENCOUNTER — Other Ambulatory Visit: Payer: Self-pay

## 2022-01-10 ENCOUNTER — Encounter (HOSPITAL_BASED_OUTPATIENT_CLINIC_OR_DEPARTMENT_OTHER): Payer: Self-pay | Admitting: Nurse Practitioner

## 2022-01-10 ENCOUNTER — Ambulatory Visit (INDEPENDENT_AMBULATORY_CARE_PROVIDER_SITE_OTHER): Payer: BC Managed Care – PPO | Admitting: Physician Assistant

## 2022-01-10 ENCOUNTER — Encounter: Payer: Self-pay | Admitting: Physician Assistant

## 2022-01-10 ENCOUNTER — Other Ambulatory Visit (HOSPITAL_BASED_OUTPATIENT_CLINIC_OR_DEPARTMENT_OTHER): Payer: Self-pay | Admitting: Nurse Practitioner

## 2022-01-10 ENCOUNTER — Telehealth: Payer: Self-pay | Admitting: Emergency Medicine

## 2022-01-10 VITALS — BP 111/61 | HR 65 | Ht 72.0 in | Wt 188.0 lb

## 2022-01-10 DIAGNOSIS — Z1329 Encounter for screening for other suspected endocrine disorder: Secondary | ICD-10-CM | POA: Diagnosis not present

## 2022-01-10 DIAGNOSIS — Z23 Encounter for immunization: Secondary | ICD-10-CM

## 2022-01-10 DIAGNOSIS — Z0000 Encounter for general adult medical examination without abnormal findings: Secondary | ICD-10-CM | POA: Diagnosis not present

## 2022-01-10 DIAGNOSIS — F32A Depression, unspecified: Secondary | ICD-10-CM | POA: Diagnosis not present

## 2022-01-10 DIAGNOSIS — Z131 Encounter for screening for diabetes mellitus: Secondary | ICD-10-CM | POA: Diagnosis not present

## 2022-01-10 DIAGNOSIS — F419 Anxiety disorder, unspecified: Secondary | ICD-10-CM

## 2022-01-10 DIAGNOSIS — E781 Pure hyperglyceridemia: Secondary | ICD-10-CM | POA: Diagnosis not present

## 2022-01-10 DIAGNOSIS — J301 Allergic rhinitis due to pollen: Secondary | ICD-10-CM

## 2022-01-10 MED ORDER — VORTIOXETINE HBR 10 MG PO TABS: 10.0000 mg | ORAL_TABLET | Freq: Every day | ORAL | 0 refills | Status: DC

## 2022-01-10 MED ORDER — FLUTICASONE PROPIONATE 50 MCG/ACT NA SUSP: NASAL | 3 refills | Status: DC

## 2022-01-10 MED ORDER — BUSPIRONE HCL 7.5 MG PO TABS: 7.5000 mg | ORAL_TABLET | Freq: Three times a day (TID) | ORAL | 3 refills | Status: DC

## 2022-01-10 MED ORDER — VORTIOXETINE HBR 5 MG PO TABS: 5.0000 mg | ORAL_TABLET | Freq: Every day | ORAL | 0 refills | Status: DC

## 2022-01-10 MED ORDER — CLONAZEPAM 0.5 MG PO TABS: 0.5000 mg | ORAL_TABLET | Freq: Two times a day (BID) | ORAL | 0 refills | Status: DC

## 2022-01-10 MED ORDER — CLOTRIMAZOLE-BETAMETHASONE 1-0.05 % EX CREA: 1.0000 | TOPICAL_CREAM | Freq: Two times a day (BID) | CUTANEOUS | 0 refills | Status: DC

## 2022-01-10 NOTE — Telephone Encounter (Signed)
Call from [facility] regarding a klonopin refill. Pt was seen by PCP today. No refills were sent in for klonopin. Pt left a message on the triage line at the PCP office before 5 pm. RN instructed pharmacy to have patient call back to PCP office now and talk to the on call provider. Call from [facility] regarding missing  prescription. Casey Baird expressed his frustration about not receiving the klonopin prescription and he doesn't want to go through withdrawal. Pt is currently driving back to [facility]. This RN explained how to contact the after hours service by calling the # for the Primary Care Office at [facility]. No other questions or concerns at this time.

## 2022-01-10 NOTE — Progress Notes (Signed)
Patient called after hours nurse line for refill on clonazepam. Chart review shows he did have appointment today. Will send refills on behalf of PCP for 1 month supply.

## 2022-01-10 NOTE — Patient Instructions (Addendum)
1/2 tablet of lexapro and '5mg'$  of trintellix. Then increase to '10mg'$  of trintellix and stop lexapro.   Increase buspar to '15mg'$  twice a day.   Health Maintenance, Male Adopting a healthy lifestyle and getting preventive care are important in promoting health and wellness. Ask your health care provider about: The right schedule for you to have regular tests and exams. Things you can do on your own to prevent diseases and keep yourself healthy. What should I know about diet, weight, and exercise? Eat a healthy diet  Eat a diet that includes plenty of vegetables, fruits, low-fat dairy products, and lean protein. Do not eat a lot of foods that are high in solid fats, added sugars, or sodium. Maintain a healthy weight Body mass index (BMI) is a measurement that can be used to identify possible weight problems. It estimates body fat based on height and weight. Your health care provider can help determine your BMI and help you achieve or maintain a healthy weight. Get regular exercise Get regular exercise. This is one of the most important things you can do for your health. Most adults should: Exercise for at least 150 minutes each week. The exercise should increase your heart rate and make you sweat (moderate-intensity exercise). Do strengthening exercises at least twice a week. This is in addition to the moderate-intensity exercise. Spend less time sitting. Even light physical activity can be beneficial. Watch cholesterol and blood lipids Have your blood tested for lipids and cholesterol at 33 years of age, then have this test every 5 years. You may need to have your cholesterol levels checked more often if: Your lipid or cholesterol levels are high. You are older than 33 years of age. You are at high risk for heart disease. What should I know about cancer screening? Many types of cancers can be detected early and may often be prevented. Depending on your health history and family history, you may  need to have cancer screening at various ages. This may include screening for: Colorectal cancer. Prostate cancer. Skin cancer. Lung cancer. What should I know about heart disease, diabetes, and high blood pressure? Blood pressure and heart disease High blood pressure causes heart disease and increases the risk of stroke. This is more likely to develop in people who have high blood pressure readings or are overweight. Talk with your health care provider about your target blood pressure readings. Have your blood pressure checked: Every 3-5 years if you are 96-11 years of age. Every year if you are 45 years old or older. If you are between the ages of 34 and 59 and are a current or former smoker, ask your health care provider if you should have a one-time screening for abdominal aortic aneurysm (AAA). Diabetes Have regular diabetes screenings. This checks your fasting blood sugar level. Have the screening done: Once every three years after age 106 if you are at a normal weight and have a low risk for diabetes. More often and at a younger age if you are overweight or have a high risk for diabetes. What should I know about preventing infection? Hepatitis B If you have a higher risk for hepatitis B, you should be screened for this virus. Talk with your health care provider to find out if you are at risk for hepatitis B infection. Hepatitis C Blood testing is recommended for: Everyone born from [birth year] through 1967. Anyone with known risk factors for hepatitis C. Sexually transmitted infections (STIs) You should be screened each year for  STIs, including gonorrhea and chlamydia, if: You are sexually active and are younger than 33 years of age. You are older than 33 years of age and your health care provider tells you that you are at risk for this type of infection. Your sexual activity has changed since you were last screened, and you are at increased risk for chlamydia or gonorrhea. Ask your health  care provider if you are at risk. Ask your health care provider about whether you are at high risk for HIV. Your health care provider may recommend a prescription medicine to help prevent HIV infection. If you choose to take medicine to prevent HIV, you should first get tested for HIV. You should then be tested every 3 months for as long as you are taking the medicine. Follow these instructions at home: Alcohol use Do not drink alcohol if your health care provider tells you not to drink. If you drink alcohol: Limit how much you have to 0-2 drinks a day. Know how much alcohol is in your drink. In the [place] one drink equals one 12 oz bottle of beer (355 mL), one 5 oz glass of wine (148 mL), or one 1 oz glass of hard liquor (44 mL). Lifestyle Do not use any products that contain nicotine or tobacco. These products include cigarettes, chewing tobacco, and vaping devices, such as e-cigarettes. If you need help quitting, ask your health care provider. Do not use street drugs. Do not share needles. Ask your health care provider for help if you need support or information about quitting drugs. General instructions Schedule regular health, dental, and eye exams. Stay current with your vaccines. Tell your health care provider if: You often feel depressed. You have ever been abused or do not feel safe at home. Summary Adopting a healthy lifestyle and getting preventive care are important in promoting health and wellness. Follow your health care provider's instructions about healthy diet, exercising, and getting tested or screened for diseases. Follow your health care provider's instructions on monitoring your cholesterol and blood pressure. This information is not intended to replace advice given to you by your health care provider. Make sure you discuss any questions you have with your health care provider. Document Revised: 07/30/2020 Document Reviewed: 07/30/2020 Elsevier Patient Education  [place].

## 2022-01-11 LAB — COMPLETE METABOLIC PANEL WITH GFR
AG Ratio: 2.6 (calc) — ABNORMAL HIGH (ref 1.0–2.5)
ALT: 25 U/L (ref 9–46)
AST: 17 U/L (ref 10–40)
Albumin: 4.9 g/dL (ref 3.6–5.1)
Alkaline phosphatase (APISO): 44 U/L (ref 36–130)
BUN: 14 mg/dL (ref 7–25)
CO2: 29 mmol/L (ref 20–32)
Calcium: 9.8 mg/dL (ref 8.6–10.3)
Chloride: 103 mmol/L (ref 98–110)
Creat: 0.91 mg/dL (ref 0.60–1.26)
Globulin: 1.9 g/dL (calc) (ref 1.9–3.7)
Glucose, Bld: 92 mg/dL (ref 65–99)
Potassium: 4.1 mmol/L (ref 3.5–5.3)
Sodium: 140 mmol/L (ref 135–146)
Total Bilirubin: 0.7 mg/dL (ref 0.2–1.2)
Total Protein: 6.8 g/dL (ref 6.1–8.1)
eGFR: 114 mL/min/{1.73_m2} (ref >=60)

## 2022-01-11 LAB — CBC WITH DIFFERENTIAL/PLATELET
Absolute Monocytes: 400 cells/uL (ref 200–950)
Basophils Absolute: 41 cells/uL (ref 0–200)
Basophils Relative: 0.7 %
Eosinophils Absolute: 70 cells/uL (ref 15–500)
Eosinophils Relative: 1.2 %
HCT: 45.8 % (ref 38.5–50.0)
Hemoglobin: 16 g/dL (ref 13.2–17.1)
Lymphs Abs: 2297 cells/uL (ref 850–3900)
MCH: 29.7 pg (ref 27.0–33.0)
MCHC: 34.9 g/dL (ref 32.0–36.0)
MCV: 85.1 fL (ref 80.0–100.0)
MPV: 10.2 fL (ref 7.5–12.5)
Monocytes Relative: 6.9 %
Neutro Abs: 2993 cells/uL (ref 1500–7800)
Neutrophils Relative %: 51.6 %
Platelets: 343 10*3/uL (ref 140–400)
RBC: 5.38 10*6/uL (ref 4.20–5.80)
RDW: 12.3 % (ref 11.0–15.0)
Total Lymphocyte: 39.6 %
WBC: 5.8 10*3/uL (ref 3.8–10.8)

## 2022-01-11 LAB — LIPID PANEL W/REFLEX DIRECT LDL
Cholesterol: 178 mg/dL (ref <200)
HDL: 47 mg/dL (ref >=40)
LDL Cholesterol (Calc): 105 mg/dL (calc) — ABNORMAL HIGH
Non-HDL Cholesterol (Calc): 131 mg/dL (calc) — ABNORMAL HIGH (ref <130)
Total CHOL/HDL Ratio: 3.8 (calc) (ref <5.0)
Triglycerides: 146 mg/dL (ref <150)

## 2022-01-11 LAB — TSH: TSH: 1.51 mIU/L (ref 0.40–4.50)

## 2022-01-13 DIAGNOSIS — F419 Anxiety disorder, unspecified: Secondary | ICD-10-CM | POA: Diagnosis not present

## 2022-01-13 NOTE — Progress Notes (Signed)
[name],   Thyroid looks good.  Kidney, liver, glucose look good.  LDL and TG improved from 2 years ago. Way to go.  Looks much better.

## 2022-01-14 MED ORDER — BUSPIRONE HCL 15 MG PO TABS: 15.0000 mg | ORAL_TABLET | Freq: Two times a day (BID) | ORAL | 0 refills | Status: DC

## 2022-01-14 MED ORDER — CLONAZEPAM 0.5 MG PO TABS: 0.5000 mg | ORAL_TABLET | Freq: Two times a day (BID) | ORAL | 5 refills | Status: DC

## 2022-01-14 NOTE — Progress Notes (Signed)
Complete physical exam  Patient: Casey Baird   DOB: 22-Aug-1990   31 y.o. Male  MRN: [id number]  Subjective:    Chief Complaint  Patient presents with   Annual Exam    Casey Baird is a 31 y.o. male who presents today for a complete physical exam. He reports consuming a general diet. The patient does not participate in regular exercise at present. He generally feels well. He reports sleeping fairly well. He does have additional problems to discuss today.   His anxiety has worsened some. He has quite a bit of job stress going on. He has been on lexapro and klonapin for a while and would like to try something else.   He has a slightly itching rash of his lateral right upper thigh.      Most recent fall risk assessment:    09/27/2019   10:05 AM  Fall Risk   Falls in the past year? 0  Number falls in past yr: 0  Injury with Fall? 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/12/2018   Dyspnea on exertion 11/10/2018   Chest tightness 08/03/2018   Shortness of breath 08/03/2018   Animal dander allergy 05/18/2018   Fatigue 06/17/2017   Non-restorative sleep 06/17/2017   Snoring 06/17/2017   Seasonal allergic rhinitis due to pollen 12/08/2016   S/P tonsillectomy 04/18/2013   Hypertriglyceridemia 11/01/2012   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   Chronic sinusitis 04/12/2012   Anxiety and depression 04/12/2012   Past Medical History:  Diagnosis Date   Anxiety and depression 04/12/2012   Chronic sinusitis 04/12/2012   Past Surgical History:  Procedure Laterality Date    TONSILLECTOMY     WISDOM TOOTH EXTRACTION     Family History  Problem Relation Age of Onset   Alcohol abuse Maternal Grandfather    Depression Other    Diabetes Maternal Uncle    Cancer Father    Allergies  Allergen Reactions   Abilify [Aripiprazole] Other (See Comments)    Suicidal Ideation   Amoxicillin Nausea And Vomiting    Did it involve swelling of the face/tongue/throat, SOB, or low BP?no Did it involve sudden or severe rash/hives, skin peeling, or any reaction on the inside of your mouth or nose? no Did you need to seek medical attention at a hospital or doctor's office? no When did it last happen? 31 years old     If all above answers are "NO", may proceed with cephalosporin use.      Patient Care Team: Lavada Mesi as PCP - General (Family Medicine)   Outpatient Medications Prior to Visit  Medication Sig   [DISCONTINUED] busPIRone (BUSPAR) 7.5 MG tablet Take 1 tablet (7.5 mg total) by mouth 3 (three) times daily.   [DISCONTINUED] clonazePAM (KLONOPIN) 0.5 MG tablet Take 1 tablet (0.5 mg total) by mouth 2 (two) times daily. APPT FOR REFILLS   [DISCONTINUED] escitalopram (LEXAPRO) 10 MG tablet Take one and one-half tablets day.   [DISCONTINUED] fluticasone (FLONASE)  50 MCG/ACT nasal spray USE 2 SPRAYS IN EACH NOSTRIL ONCE A DAY AS NEEDED FOR ALLERGIES OR RHINITIS   [DISCONTINUED] pseudoephedrine (SUDAFED) 120 MG 12 hr tablet Take 120 mg by mouth 2 (two) times daily.   No facility-administered medications prior to visit.    ROS   See HPI.      Objective:     BP 111/61   Pulse 65   Ht 6' (1.829 m)   Wt 188 lb (85.3 kg)   SpO2 99%   BMI 25.50 kg/m  BP Readings from Last 3 Encounters:  01/10/22 111/61  09/27/19 131/82  06/20/19 (!) 149/102   Wt Readings from Last 3 Encounters:  01/10/22 188 lb (85.3 kg)  04/15/21 210 lb (95.3 kg)  09/27/19 209 lb (94.8 kg)     ..    01/10/2022    1:23 PM 04/15/2021    3:32 PM 07/09/2020    9:18 AM  09/27/2019   10:05 AM 08/02/2018    8:43 AM  Depression screen PHQ 2/9  Decreased Interest 1 2 0 1 2  Down, Depressed, Hopeless 1 3 0 1 2  PHQ - 2 Score 2 5 0 2 4  Altered sleeping 1 1 0 1 2  Tired, decreased energy '1 2 1 1 1  '$ Change in appetite 2 2 0 0 1  Feeling bad or failure about yourself  2 3 0 0 2  Trouble concentrating 0  1 0 2  Moving slowly or fidgety/restless 0 2 0 0 0  Suicidal thoughts 0 0  0 1  PHQ-9 Score '8 15 2 4 13  '$ Difficult doing work/chores Somewhat difficult  Not difficult at all Not difficult at all Somewhat difficult   ..    01/10/2022    1:23 PM 04/15/2021    3:33 PM 07/09/2020    9:18 AM 09/27/2019   10:06 AM  GAD 7 : Generalized Anxiety Score  Nervous, Anxious, on Edge '3 3 2 2  '$ Control/stop worrying '3 3 2 2  '$ Worry too much - different things '1 3 2 2  '$ Trouble relaxing '1 3 2 2  '$ Restless 0 2 2 0  Easily annoyed or irritable '1 1 2 '$ 0  Afraid - awful might happen '1 1 2 '$ 0  Total GAD 7 Score '10 16 14 8  '$ Anxiety Difficulty Somewhat difficult  Very difficult Somewhat difficult     Physical Exam  BP 111/61   Pulse 65   Ht 6' (1.829 m)   Wt 188 lb (85.3 kg)   SpO2 99%   BMI 25.50 kg/m   General Appearance:    Alert, cooperative, no distress, appears stated age  Head:    Normocephalic, without obvious abnormality, atraumatic  Eyes:    PERRL, conjunctiva/corneas clear, EOM's intact, fundi    benign, both eyes       Ears:    Normal TM's and external ear canals, both ears  Nose:   Nares normal, septum midline, mucosa normal, no drainage    or sinus tenderness  Throat:   Lips, mucosa, and tongue normal; teeth and gums normal  Neck:   Supple, symmetrical, trachea midline, no adenopathy;       thyroid:  No enlargement/tenderness/nodules; no carotid   bruit or JVD  Back:     Symmetric, no curvature, ROM normal, no CVA tenderness  Lungs:     Clear to auscultation bilaterally, respirations unlabored  Chest wall:    No tenderness or deformity  Heart:  Regular  rate and rhythm, S1 and S2 normal, no murmur, rub   or gallop  Abdomen:     Soft, non-tender, bowel sounds active all four quadrants,    no masses, no organomegaly        Extremities:   Extremities normal, atraumatic, no cyanosis or edema  Pulses:   2+ and symmetric all extremities  Skin:   Skin color, texture, turgor normal, macular peachy colored rash with no scales diffuse over lateral right upper thigh  Lymph nodes:   Cervical, supraclavicular, and axillary nodes normal  Neurologic:   CNII-XII intact. Normal strength, sensation and reflexes      throughout      Assessment & Plan:    Routine Health Maintenance and Physical Exam   ..Start a regular exercise program and make sure you are eating a healthy diet Try to eat 4 servings of dairy a day or take a calcium supplement ('500mg'$  twice a day). Fasting labs reviewed PHQ/GAD-7 elevated Taper off lexapro and on to '10mg'$  of trintellix Continue on klonapin Buspar increase to '15mg'$  bid Lotrisone cream for rash suspect eczema or fungus Follow up in 6 months    Flu shot given today without complication.   Iran Planas, PA-C

## 2022-01-28 DIAGNOSIS — F419 Anxiety disorder, unspecified: Secondary | ICD-10-CM | POA: Diagnosis not present
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
## 2022-02-03 DIAGNOSIS — F419 Anxiety disorder, unspecified: Secondary | ICD-10-CM | POA: Diagnosis not present

## 2022-02-11 DIAGNOSIS — F419 Anxiety disorder, unspecified: Secondary | ICD-10-CM | POA: Diagnosis not present

## 2022-02-26 DIAGNOSIS — F419 Anxiety disorder, unspecified: Secondary | ICD-10-CM | POA: Diagnosis not present

## 2022-03-11 DIAGNOSIS — F419 Anxiety disorder, unspecified: Secondary | ICD-10-CM | POA: Diagnosis not present

## 2022-03-26 DIAGNOSIS — F419 Anxiety disorder, unspecified: Secondary | ICD-10-CM | POA: Diagnosis not present

## 2022-03-31 DIAGNOSIS — F419 Anxiety disorder, unspecified: Secondary | ICD-10-CM | POA: Diagnosis not present

## 2022-04-07 ENCOUNTER — Other Ambulatory Visit: Payer: Self-pay | Admitting: Physician Assistant

## 2022-04-08 DIAGNOSIS — F419 Anxiety disorder, unspecified: Secondary | ICD-10-CM | POA: Diagnosis not present

## 2022-04-10 ENCOUNTER — Other Ambulatory Visit: Payer: Self-pay | Admitting: Physician Assistant

## 2022-04-10 DIAGNOSIS — J301 Allergic rhinitis due to pollen: Secondary | ICD-10-CM

## 2022-04-22 DIAGNOSIS — F419 Anxiety disorder, unspecified: Secondary | ICD-10-CM | POA: Diagnosis not present

## 2022-04-29 DIAGNOSIS — F419 Anxiety disorder, unspecified: Secondary | ICD-10-CM | POA: Diagnosis not present

## 2022-05-02 ENCOUNTER — Other Ambulatory Visit: Payer: Self-pay | Admitting: Physician Assistant

## 2022-05-05 DIAGNOSIS — F419 Anxiety disorder, unspecified: Secondary | ICD-10-CM | POA: Diagnosis not present

## 2022-05-09 ENCOUNTER — Other Ambulatory Visit: Payer: Self-pay | Admitting: Physician Assistant

## 2022-05-13 DIAGNOSIS — F419 Anxiety disorder, unspecified: Secondary | ICD-10-CM | POA: Diagnosis not present

## 2022-05-20 DIAGNOSIS — F419 Anxiety disorder, unspecified: Secondary | ICD-10-CM | POA: Diagnosis not present

## 2022-05-21 ENCOUNTER — Telehealth: Payer: BC Managed Care – PPO | Admitting: Physician Assistant

## 2022-05-21 ENCOUNTER — Encounter: Payer: Self-pay | Admitting: Physician Assistant

## 2022-05-21 VITALS — Ht 72.0 in | Wt 205.0 lb

## 2022-05-21 DIAGNOSIS — F32A Depression, unspecified: Secondary | ICD-10-CM | POA: Diagnosis not present

## 2022-05-21 DIAGNOSIS — F419 Anxiety disorder, unspecified: Secondary | ICD-10-CM

## 2022-05-21 DIAGNOSIS — J301 Allergic rhinitis due to pollen: Secondary | ICD-10-CM | POA: Diagnosis not present

## 2022-05-21 MED ORDER — CLONAZEPAM 0.5 MG PO TABS: 0.5000 mg | ORAL_TABLET | Freq: Two times a day (BID) | ORAL | 0 refills | Status: AC

## 2022-05-21 MED ORDER — VORTIOXETINE HBR 10 MG PO TABS: 10.0000 mg | ORAL_TABLET | Freq: Every day | ORAL | 0 refills | Status: DC

## 2022-05-21 MED ORDER — BUSPIRONE HCL 15 MG PO TABS: 15.0000 mg | ORAL_TABLET | Freq: Two times a day (BID) | ORAL | 0 refills | Status: AC

## 2022-05-21 NOTE — Progress Notes (Signed)
..Virtual Visit via Video Note  I connected with Casey Baird on 05/21/22 at  1:40 PM EST by a video enabled telemedicine application and verified that I am speaking with the correct person using two identifiers.  Location: Patient: home Provider: clinic  .Marland KitchenParticipating in visit:  Patient: Casey Baird Provider: Iran Planas PA-C   I discussed the limitations of evaluation and management by telemedicine and the availability of in person appointments. The patient expressed understanding and agreed to proceed.  History of Present Illness: Pt is a 33 yo male with anxiety, depression, allergic rhinitis who calls in for medication follow up.   Last visit lexapro was stopped and trintellix was started. He has continued klonapin and buspar. He is doing much better. He denies any suicidal thoughts, increase in anxiety or depression. He did get a new job and moving to [place]. He is happy about this change. He is sleeping well. He has no concerns.    .. 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   Hypertriglyceridemia 11/01/2012   S/P tonsillectomy 04/18/2013   Seasonal allergic rhinitis due to pollen 12/08/2016   Fatigue 06/17/2017   Non-restorative sleep 06/17/2017   Snoring 06/17/2017   Animal dander allergy 05/18/2018   Chest tightness 08/03/2018   Shortness of breath 08/03/2018   Dyspnea on exertion 11/10/2018   Reactive airway disease that is not asthma 11/12/2018   Pulmonary nodule 11/19/2018   Resolved Ambulatory Problems    Diagnosis Date Noted   Calculus, tonsil 07/26/2012   Persistent cough for 3 weeks or longer 11/10/2018   Alcohol use 09/27/2019   No Additional Past Medical History    Observations/Objective: No acute distress Normal mood and appearance Normal breathing  .Marland Kitchen    05/21/2022    1:34 PM 01/10/2022    1:23 PM 04/15/2021    3:32 PM 07/09/2020     9:18 AM 09/27/2019   10:05 AM  Depression screen PHQ 2/9  Decreased Interest 0 1 2 0 1  Down, Depressed, Hopeless 0 1 3 0 1  PHQ - 2 Score 0 2 5 0 2  Altered sleeping 0 1 1 0 1  Tired, decreased energy '1 1 2 1 1  '$ Change in appetite 0 2 2 0 0  Feeling bad or failure about yourself  0 2 3 0 0  Trouble concentrating 0 0  1 0  Moving slowly or fidgety/restless 0 0 2 0 0  Suicidal thoughts 0 0 0  0  PHQ-9 Score '1 8 15 2 4  '$ Difficult doing work/chores Not difficult at all Somewhat difficult  Not difficult at all Not difficult at all   ..    05/21/2022    1:35 PM 01/10/2022    1:23 PM 04/15/2021    3:33 PM 07/09/2020    9:18 AM  GAD 7 : Generalized Anxiety Score  Nervous, Anxious, on Edge '1 3 3 2  '$ Control/stop worrying 0 '3 3 2  '$ Worry too much - different things 0 '1 3 2  '$ Trouble relaxing '1 1 3 2  '$ Restless 1 0 2 2  Easily annoyed or irritable 0 '1 1 2  '$ Afraid - awful might happen 0 '1 1 2  '$ Total GAD 7 Score '3 10 16 14  '$ Anxiety Difficulty Not difficult at all Somewhat difficult  Very difficult       Assessment and Plan: Marland KitchenMarland KitchenKorrie was seen today  for medication refill.  Diagnoses and all orders for this visit:  Anxiety and depression -     vortioxetine HBr (TRINTELLIX) 10 MG TABS tablet; Take 1 tablet (10 mg total) by mouth daily. -     busPIRone (BUSPAR) 15 MG tablet; Take 1 tablet (15 mg total) by mouth 2 (two) times daily. -     clonazePAM (KLONOPIN) 0.5 MG tablet; Take 1 tablet (0.5 mg total) by mouth 2 (two) times daily.  Seasonal allergic rhinitis due to pollen   PHQ/GAD controlled Trintellix/Buspar/klonapin refilled He is [place] out of [place] and will have to find a new provider in the next 6 months Continue flonase for allergic rhinitis Follow up in 6 months   Follow Up Instructions:    I discussed the assessment and treatment plan with the patient. The patient was provided an opportunity to ask questions and all were answered. The patient agreed with the plan and  demonstrated an understanding of the instructions.   The patient was advised to call back or seek an in-person evaluation if the symptoms worsen or if the condition fails to improve as anticipated.    Iran Planas, PA-C

## 2022-05-21 NOTE — Progress Notes (Signed)
Called patient verified name and dob. Patient doing virtual visit for medication refills. No other complaints. Patient states he weighs 205lb. Unable to take any other vitals

## 2022-05-26 DIAGNOSIS — F419 Anxiety disorder, unspecified: Secondary | ICD-10-CM | POA: Diagnosis not present

## 2022-06-02 DIAGNOSIS — F419 Anxiety disorder, unspecified: Secondary | ICD-10-CM | POA: Diagnosis not present

## 2022-06-09 ENCOUNTER — Other Ambulatory Visit: Payer: Self-pay | Admitting: Neurology

## 2022-06-09 DIAGNOSIS — F419 Anxiety disorder, unspecified: Secondary | ICD-10-CM

## 2022-06-09 MED ORDER — VORTIOXETINE HBR 10 MG PO TABS: 10.0000 mg | ORAL_TABLET | Freq: Every day | ORAL | 0 refills | Status: AC

## 2022-08-26 ENCOUNTER — Other Ambulatory Visit: Payer: Self-pay | Admitting: Physician Assistant

## 2022-08-26 DIAGNOSIS — F419 Anxiety disorder, unspecified: Secondary | ICD-10-CM
